# Patient Record
Sex: Male | Born: 1955 | Race: Black or African American | Hispanic: No | Marital: Married | Smoking: Former smoker
Health system: Southern US, Community
[De-identification: ages and names within clinical notes are randomized; demographics above are authoritative.]

## PROBLEM LIST (undated history)

## (undated) DIAGNOSIS — J309 Allergic rhinitis, unspecified: Secondary | ICD-10-CM

## (undated) DIAGNOSIS — B1920 Unspecified viral hepatitis C without hepatic coma: Secondary | ICD-10-CM

## (undated) DIAGNOSIS — E785 Hyperlipidemia, unspecified: Secondary | ICD-10-CM

## (undated) DIAGNOSIS — I10 Essential (primary) hypertension: Secondary | ICD-10-CM

## (undated) DIAGNOSIS — E119 Type 2 diabetes mellitus without complications: Secondary | ICD-10-CM

## (undated) DIAGNOSIS — H5210 Myopia, unspecified eye: Secondary | ICD-10-CM

## (undated) DIAGNOSIS — M130 Polyarthritis, unspecified: Secondary | ICD-10-CM

## (undated) HISTORY — DX: Allergic rhinitis, unspecified: J30.9

## (undated) HISTORY — DX: Myopia, unspecified eye: H52.10

## (undated) HISTORY — DX: Polyarthritis, unspecified: M13.0

## (undated) HISTORY — DX: Hyperlipidemia, unspecified: E78.5

## (undated) HISTORY — PX: TUMOR REMOVAL: SHX12

---

## 2013-09-02 ENCOUNTER — Emergency Department (HOSPITAL_COMMUNITY)
Admission: EM | Admit: 2013-09-02 | Discharge: 2013-09-02 | Discharge status: Against Medical Advice | Payer: Medicaid Other | Attending: Emergency Medicine | Admitting: Emergency Medicine

## 2013-09-02 ENCOUNTER — Encounter (HOSPITAL_COMMUNITY): Payer: Self-pay | Admitting: Emergency Medicine

## 2013-09-02 ENCOUNTER — Emergency Department (HOSPITAL_COMMUNITY): Payer: Medicaid Other

## 2013-09-02 DIAGNOSIS — Z87891 Personal history of nicotine dependence: Secondary | ICD-10-CM | POA: Diagnosis not present

## 2013-09-02 DIAGNOSIS — I10 Essential (primary) hypertension: Secondary | ICD-10-CM | POA: Diagnosis present

## 2013-09-02 DIAGNOSIS — R079 Chest pain, unspecified: Secondary | ICD-10-CM

## 2013-09-02 DIAGNOSIS — R0602 Shortness of breath: Secondary | ICD-10-CM | POA: Insufficient documentation

## 2013-09-02 DIAGNOSIS — G8929 Other chronic pain: Secondary | ICD-10-CM

## 2013-09-02 DIAGNOSIS — H409 Unspecified glaucoma: Secondary | ICD-10-CM | POA: Diagnosis present

## 2013-09-02 DIAGNOSIS — Z8619 Personal history of other infectious and parasitic diseases: Secondary | ICD-10-CM | POA: Diagnosis not present

## 2013-09-02 DIAGNOSIS — E119 Type 2 diabetes mellitus without complications: Secondary | ICD-10-CM | POA: Diagnosis present

## 2013-09-02 HISTORY — DX: Essential (primary) hypertension: I10

## 2013-09-02 HISTORY — DX: Type 2 diabetes mellitus without complications: E11.9

## 2013-09-02 HISTORY — DX: Other chronic pain: G89.29

## 2013-09-02 HISTORY — DX: Chest pain, unspecified: R07.9

## 2013-09-02 HISTORY — DX: Unspecified viral hepatitis C without hepatic coma: B19.20

## 2013-09-02 HISTORY — DX: Unspecified glaucoma: H40.9

## 2013-09-02 LAB — CBC
HCT: 45 % (ref 39.0–52.0)
MCHC: 34 g/dL (ref 30.0–36.0)
Platelets: 180 10*3/uL (ref 150–400)
RDW: 13.7 % (ref 11.5–15.5)
WBC: 8.1 10*3/uL (ref 4.0–10.5)

## 2013-09-02 LAB — POCT I-STAT TROPONIN I: Troponin i, poc: 0 ng/mL (ref 0.00–0.08)

## 2013-09-02 LAB — BASIC METABOLIC PANEL
BUN: 12 mg/dL (ref 6–23)
Chloride: 100 mEq/L (ref 96–112)
GFR calc Af Amer: >90 mL/min (ref >90)
GFR calc non Af Amer: 88 mL/min — ABNORMAL LOW (ref >90)
Potassium: 4.3 mEq/L (ref 3.5–5.1)
Sodium: 137 mEq/L (ref 135–145)

## 2013-09-02 MED ORDER — NITROGLYCERIN 0.4 MG SL SUBL: 0.4000 mg | SUBLINGUAL_TABLET | SUBLINGUAL | Status: DC | PRN

## 2013-09-02 MED ORDER — ASPIRIN 325 MG PO TABS
325.0000 mg | ORAL_TABLET | ORAL | Status: AC
  Administered 2013-09-02: 325 mg via ORAL
  Filled 2013-09-02: qty 1

## 2013-09-02 NOTE — ED Provider Notes (Signed)
Medical screening examination/treatment/procedure(s) were performed by non-physician practitioner and as supervising physician I was immediately available for consultation/collaboration.    Prosper Paff D Lakindra Wible, MD 09/02/13 1934 

## 2013-09-02 NOTE — ED Notes (Signed)
Pt brought back from triage; pt undressed, in gown, on monitor, continuous pulse oximetry and blood pressure cuff; family at bedside; vitals and EKG being performed

## 2013-09-02 NOTE — ED Notes (Signed)
PA at bedside.

## 2013-09-02 NOTE — ED Provider Notes (Signed)
CSN: [id number]     Arrival date & time 09/02/13  4098 History   First MD Initiated Contact with Patient 09/02/13 0901     Chief Complaint  Patient presents with  . Chest Pain   (Consider location/radiation/quality/duration/timing/severity/associated sxs/prior Treatment) HPI Comments: Patient presents to the emergency department with chief complaint of chest pain. He states the chest pain began this morning at around 7:30. He endorses associated shortness of breath, but is uncertain whether he had any diaphoresis. He's not doing anything when the pain occurred. He is states that the pain was crushing in nature. It did not radiate. States that on last for a few minutes. States the pain was 10. He has taken aspirin. Currently, he denies any pain. Cardiac risk factors include hypertension, diabetes, and hyperlipidemia. He does not smoke, nor does he have family history of heart disease.  The history is provided by the patient. No language interpreter was used.    Past Medical History  Diagnosis Date  . Hypertension   . Diabetes mellitus without complication   . 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 Tumor removal      Behind left ear   History reviewed. No pertinent family history. History  Substance Use Topics  . Smoking status: Former Smoker    Quit date: 09/02/2001  . Smokeless tobacco: Never Used  . Alcohol Use: Not on file    Review of Systems  All other systems reviewed and are negative.    Allergies  Review of patient's allergies indicates no known allergies.  Home Medications  No current outpatient prescriptions on file. BP 121/78  Pulse 72  Temp(Src) 98 F (36.7 C) (Oral)  Resp 16  Ht 5\' 11"  (1.803 m)  Wt 194 lb (87.998 kg)  BMI 27.07 kg/m2  SpO2 98% Physical Exam  Nursing note and vitals reviewed. Constitutional: He is oriented to person, place, and time. He appears well-developed and  well-nourished.  HENT:  Head: Normocephalic and atraumatic.  Right Ear: External ear normal.  Left Ear: External ear normal.  Nose: Nose normal.  Mouth/Throat: Oropharynx is clear and moist. No oropharyngeal exudate.  Eyes: Conjunctivae and EOM are normal. Pupils are equal, round, and reactive to light. Right eye exhibits no discharge. Left eye exhibits no discharge. No scleral icterus.  Neck: Normal range of motion. Neck supple. No JVD present.  Cardiovascular: Normal rate, regular rhythm, normal heart sounds and intact distal pulses.  Exam reveals no gallop and no friction rub.   No murmur heard. Pulmonary/Chest: Effort normal and breath sounds normal. No respiratory distress. He has no wheezes. He has no rales. He exhibits no tenderness.  Abdominal: Soft. Bowel sounds are normal. He exhibits no distension and no mass. There is no tenderness. There is no rebound and no guarding.  Musculoskeletal: Normal range of motion. He exhibits no edema and no tenderness.  Neurological: He is alert and oriented to person, place, and time. He has normal reflexes.  CN 3-12 intact  Skin: Skin is warm and dry.  Psychiatric: He has a normal mood and affect. His behavior is normal. Judgment and thought content normal.    ED Course  Procedures (including critical care time) Labs Review Labs Reviewed  CBC  BASIC METABOLIC PANEL   ED ECG REPORT  I personally interpreted this EKG   Date: 09/02/2013   Rate: 75  Rhythm: premature atrial contractions (PAC)  QRS Axis: normal  Intervals:  normal  ST/T Wave abnormalities: normal  Conduction Disutrbances:none  Narrative Interpretation:   Old EKG Reviewed: none available   Results for orders placed during the hospital encounter of 09/02/13  CBC      Result Value Range   WBC 8.1  4.0 - 10.5 K/uL   RBC 5.38  4.22 - 5.81 MIL/uL   Hemoglobin 15.3  13.0 - 17.0 g/dL   HCT 40.9  81.1 - 91.4 %   MCV 83.6  78.0 - 100.0 fL   MCH 28.4  26.0 - 34.0 pg   MCHC  34.0  30.0 - 36.0 g/dL   RDW 78.2  95.6 - 21.3 %   Platelets 180  150 - 400 K/uL  BASIC METABOLIC PANEL      Result Value Range   Sodium 137  135 - 145 mEq/L   Potassium 4.3  3.5 - 5.1 mEq/L   Chloride 100  96 - 112 mEq/L   CO2 26  19 - 32 mEq/L   Glucose, Bld 122 (*) 70 - 99 mg/dL   BUN 12  6 - 23 mg/dL   Creatinine, Ser 0.86  0.50 - 1.35 mg/dL   Calcium 9.8  8.4 - 57.8 mg/dL   GFR calc non Af Amer 88 (*) >90 mL/min   GFR calc Af Amer >90  >90 mL/min  POCT I-STAT TROPONIN I      Result Value Range   Troponin i, poc 0.00  0.00 - 0.08 ng/mL   Comment 3            Dg Chest Port 1 View  09/02/2013   CLINICAL DATA:  Hypertension, chest pressure.  EXAM: PORTABLE CHEST - 1 VIEW  COMPARISON:  None.  FINDINGS: Mild hyperinflation. Heart and mediastinal contours are within normal limits. No focal opacities or effusions. No acute bony abnormality.  IMPRESSION: No active disease.   Electronically Signed   By: Charlett Nose M.D.   On: 09/02/2013 09:47      MDM   1. Chest pain     Patient was crushing substernal chest pain this morning. Currently pain free. Cardiac risk factors include hypertension, diabetes, and hyperlipidemia. The pain was associated with shortness of breath, and it radiates to both arms. Patient is new to the area. He does not have a primary care provider. Patient discussed with Dr. Hyacinth Meeker, who recommends admission for ACS rule out. We'll discuss the patient with internal medicine unassigned.  Heart score is 4, indicating moderate risk. Will admit the patient to internal medicine unassigned.   Patients that they would like to leave.  I have discussed my concerns with the patient about them leaving without completing the evaluation.    Patient presents with chest pain  Symptoms include: chest pain and SOB  Concern for: ACS  Study limitations and other tests offered include: timing and observation  Treatment and recommended follow-up include: continue medications,  stop ginseng, follow-up with [facility].  Patient is not altered, has capacity to make his own decisions.  I do not feel that the patient should leave prior to completing their workup. I have discussed the above symptoms, initial findings, study limitations, and treatment plan with the patient. Patient places themselves at risk of acute coronary syndrome, worsening symptoms, disability, and/or death.   Roxy Horseman, PA-C 09/02/13 1031  Roxy Horseman, PA-C 09/02/13 1101

## 2013-09-02 NOTE — ED Notes (Signed)
Pt from home, c/o cp @730  lasting for a few minutes. Denies pain at this time.

## 2013-09-08 ENCOUNTER — Ambulatory Visit: Payer: Self-pay

## 2013-09-17 ENCOUNTER — Ambulatory Visit: Payer: Self-pay | Attending: Internal Medicine

## 2014-02-12 ENCOUNTER — Emergency Department (HOSPITAL_COMMUNITY)
Admission: EM | Admit: 2014-02-12 | Discharge: 2014-02-12 | Disposition: A | Discharge status: Home / Self Care | Payer: Medicaid Other | Source: Home / Self Care | Attending: Family Medicine | Admitting: Family Medicine

## 2014-02-12 ENCOUNTER — Encounter (HOSPITAL_COMMUNITY): Payer: Self-pay | Admitting: Emergency Medicine

## 2014-02-12 DIAGNOSIS — M3390 Dermatopolymyositis, unspecified, organ involvement unspecified: Secondary | ICD-10-CM

## 2014-02-12 DIAGNOSIS — I10 Essential (primary) hypertension: Secondary | ICD-10-CM

## 2014-02-12 DIAGNOSIS — H409 Unspecified glaucoma: Secondary | ICD-10-CM

## 2014-02-12 LAB — POCT I-STAT, CHEM 8
BUN: 14 mg/dL (ref 6–23)
CALCIUM ION: 1.25 mmol/L — AB (ref 1.12–1.23)
CHLORIDE: 101 meq/L (ref 96–112)
Creatinine, Ser: 1 mg/dL (ref 0.50–1.35)
Glucose, Bld: 157 mg/dL — ABNORMAL HIGH (ref 70–99)
HCT: 50 % (ref 39.0–52.0)
Hemoglobin: 17 g/dL (ref 13.0–17.0)
Potassium: 4 mEq/L (ref 3.7–5.3)
Sodium: 142 mEq/L (ref 137–147)
TCO2: 26 mmol/L (ref 0–100)

## 2014-02-12 MED ORDER — TIMOLOL MALEATE 0.5 % OP SOLN: 1.0000 [drp] | Freq: Every day | OPHTHALMIC | Status: AC

## 2014-02-12 MED ORDER — LISINOPRIL 10 MG PO TABS
10.0000 mg | ORAL_TABLET | Freq: Every day | ORAL | Status: DC
Start: 2014-02-12 — End: 2015-06-22

## 2014-02-12 MED ORDER — SIMVASTATIN 40 MG PO TABS: 40.0000 mg | ORAL_TABLET | Freq: Every day | ORAL | Status: DC

## 2014-02-12 MED ORDER — METFORMIN HCL 1000 MG PO TABS: 1000.0000 mg | ORAL_TABLET | Freq: Two times a day (BID) | ORAL | Status: AC

## 2014-02-12 MED ORDER — TRAVOPROST 0.004 % OP SOLN: 1.0000 [drp] | Freq: Every day | OPHTHALMIC | Status: AC

## 2014-02-12 NOTE — ED Notes (Signed)
Pt reports having to cut metformin pills in half to make them last.  Mw,cma

## 2014-02-12 NOTE — ED Provider Notes (Signed)
Andre Wilcox is a 58 y.o. male who presents to Urgent Care today for medication refill. Patient was recently released from prison. He is running out of his chronic medications. He is asymptomatic. He is establishing with a primary care provider soon. No fevers or chills nausea vomiting or diarrhea. Main medical problems are hypertension diabetes and glaucoma.   Past Medical History  Diagnosis Date  . Hypertension   . Diabetes mellitus without complication   . Unspecified viral hepatitis C without hepatic coma    History  Substance Use Topics  . Smoking status: Former Smoker    Quit date: 09/02/2001  . Smokeless tobacco: Never Used  . Alcohol Use: Not on file   ROS as above Medications: No current facility-administered medications for this encounter.   Current Outpatient Prescriptions  Medication Sig Dispense Refill  . TIMOLOL MALEATE PO Place 1 drop into both eyes every morning.      . Travoprost, BAK Free, (TRAVATAN) 0.004 % SOLN ophthalmic solution Place 1 drop into both eyes at bedtime.      Marland Kitchen. aspirin EC 81 MG tablet Take 81 mg by mouth daily.      Marland Kitchen. lisinopril (PRINIVIL,ZESTRIL) 10 MG tablet Take 1 tablet (10 mg total) by mouth daily.  30 tablet  1  . metFORMIN (GLUCOPHAGE) 1000 MG tablet Take 1 tablet (1,000 mg total) by mouth 2 (two) times daily with a meal.  60 tablet  1  . simvastatin (ZOCOR) 40 MG tablet Take 1 tablet (40 mg total) by mouth daily.  30 tablet  1  . timolol (TIMOPTIC) 0.5 % ophthalmic solution Place 1 drop into both eyes daily.  10 mL  1  . travoprost, benzalkonium, (TRAVATAN) 0.004 % ophthalmic solution Place 1 drop into both eyes at bedtime.  2.5 mL  1    Exam:  BP 154/89  Pulse 75  Temp(Src) 98.1 F (36.7 C) (Oral)  Resp 20  SpO2 98% Gen: Well NAD HEENT: EOMI,  MMM Lungs: Normal work of breathing. CTABL Heart: RRR no MRG Abd: NABS, Soft. NT, ND Exts: Brisk capillary refill, warm and well perfused.   Results for orders placed during the  hospital encounter of 02/12/14 (from the past 24 hour(s))  POCT I-STAT, CHEM 8     Status: Abnormal   Collection Time    02/12/14 11:55 AM      Result Value Ref Range   Sodium 142  137 - 147 mEq/L   Potassium 4.0  3.7 - 5.3 mEq/L   Chloride 101  96 - 112 mEq/L   BUN 14  6 - 23 mg/dL   Creatinine, Ser 1.611.00  0.50 - 1.35 mg/dL   Glucose, Bld 096157 (*) 70 - 99 mg/dL   Calcium, Ion 0.451.25 (*) 1.12 - 1.23 mmol/L   TCO2 26  0 - 100 mmol/L   Hemoglobin 17.0  13.0 - 17.0 g/dL   HCT 40.950.0  81.139.0 - 91.452.0 %   No results found.  Assessment and Plan: 58 y.o. male with hypertension diabetes and glaucoma. Plan to refill Travatan, timolol eyedrops, lisinopril, metformin and simvastatin. 2 month supply.  Followup with primary care provider as soon as possible.  Discussed warning signs or symptoms. Please see discharge instructions. Patient expresses understanding.    Rodolph BongEvan S Rene Gonsoulin, MD 02/12/14 [phone]

## 2014-02-12 NOTE — Discharge Instructions (Signed)
Thank you for coming in today. Establish with a primary doctor and an eye doctor in the next 2 months.  Ask DSS for a list of doctors to get Medicaid in your area. Call or go to the emergency room if you get worse, have trouble breathing, have chest pains, or palpitations.   Arterial Hypertension Arterial hypertension (high blood pressure) is a condition of elevated pressure in your blood vessels. Hypertension over a long period of time is a risk factor for strokes, heart attacks, and heart failure. It is also the leading cause of kidney (renal) failure.  CAUSES   In Adults -- Over 90% of all hypertension has no known cause. This is called essential or primary hypertension. In the other 10% of people with hypertension, the increase in blood pressure is caused by another disorder. This is called secondary hypertension. Important causes of secondary hypertension are:  Heavy alcohol use.  Obstructive sleep apnea.  Hyperaldosterosim (Conn's syndrome).  Steroid use.  Chronic kidney failure.  Hyperparathyroidism.  Medications.  Renal artery stenosis.  Pheochromocytoma.  Cushing's disease.  Coarctation of the aorta.  Scleroderma renal crisis.  Licorice (in excessive amounts).  Drugs (cocaine, methamphetamine). Your caregiver can explain any items above that apply to you.  In Children -- Secondary hypertension is more common and should always be considered.  Pregnancy -- Few women of childbearing age have high blood pressure. However, up to 10% of them develop hypertension of pregnancy. Generally, this will not harm the woman. It may be a sign of 3 complications of pregnancy: preeclampsia, HELLP syndrome, and eclampsia. Follow up and control with medication is necessary. SYMPTOMS   This condition normally does not produce any noticeable symptoms. It is usually found during a routine exam.  Malignant hypertension is a late problem of high blood pressure. It may have the  following symptoms:  Headaches.  Blurred vision.  End-organ damage (this means your kidneys, heart, lungs, and other organs are being damaged).  Stressful situations can increase the blood pressure. If a person with normal blood pressure has their blood pressure go up while being seen by their caregiver, this is often termed "white coat hypertension." Its importance is not known. It may be related with eventually developing hypertension or complications of hypertension.  Hypertension is often confused with mental tension, stress, and anxiety. DIAGNOSIS  The diagnosis is made by 3 separate blood pressure measurements. They are taken at least 1 week apart from each other. If there is organ damage from hypertension, the diagnosis may be made without repeat measurements. Hypertension is usually identified by having blood pressure readings:  Above 140/90 mmHg measured in both arms, at 3 separate times, over a couple weeks.  Over 130/80 mmHg should be considered a risk factor and may require treatment in patients with diabetes. Blood pressure readings over 120/80 mmHg are called "pre-hypertension" even in non-diabetic patients. To get a true blood pressure measurement, use the following guidelines. Be aware of the factors that can alter blood pressure readings.  Take measurements at least 1 hour after caffeine.  Take measurements 30 minutes after smoking and without any stress. This is another reason to quit smoking  it raises your blood pressure.  Use a proper cuff size. Ask your caregiver if you are not sure about your cuff size.  Most home blood pressure cuffs are automatic. They will measure systolic and diastolic pressures. The systolic pressure is the pressure reading at the start of sounds. Diastolic pressure is the pressure at which  the sounds disappear. If you are elderly, measure pressures in multiple postures. Try sitting, lying or standing.  Sit at rest for a minimum of 5 minutes  before taking measurements.  You should not be on any medications like decongestants. These are found in many cold medications.  Record your blood pressure readings and review them with your caregiver. If you have hypertension:  Your caregiver may do tests to be sure you do not have secondary hypertension (see "causes" above).  Your caregiver may also look for signs of metabolic syndrome. This is also called Syndrome X or Insulin Resistance Syndrome. You may have this syndrome if you have type 2 diabetes, abdominal obesity, and abnormal blood lipids in addition to hypertension.  Your caregiver will take your medical and family history and perform a physical exam.  Diagnostic tests may include blood tests (for glucose, cholesterol, potassium, and kidney function), a urinalysis, or an EKG. Other tests may also be necessary depending on your condition. PREVENTION  There are important lifestyle issues that you can adopt to reduce your chance of developing hypertension:  Maintain a normal weight.  Limit the amount of salt (sodium) in your diet.  Exercise often.  Limit alcohol intake.  Get enough potassium in your diet. Discuss specific advice with your caregiver.  Follow a DASH diet (dietary approaches to stop hypertension). This diet is rich in fruits, vegetables, and low-fat dairy products, and avoids certain fats. PROGNOSIS  Essential hypertension cannot be cured. Lifestyle changes and medical treatment can lower blood pressure and reduce complications. The prognosis of secondary hypertension depends on the underlying cause. Many people whose hypertension is controlled with medicine or lifestyle changes can live a normal, healthy life.  RISKS AND COMPLICATIONS  While high blood pressure alone is not an illness, it often requires treatment due to its short- and long-term effects on many organs. Hypertension increases your risk for:  CVAs or strokes (cerebrovascular accident).  Heart  failure due to chronically high blood pressure (hypertensive cardiomyopathy).  Heart attack (myocardial infarction).  Damage to the retina (hypertensive retinopathy).  Kidney failure (hypertensive nephropathy). Your caregiver can explain list items above that apply to you. Treatment of hypertension can significantly reduce the risk of complications. TREATMENT   For overweight patients, weight loss and regular exercise are recommended. Physical fitness lowers blood pressure.  Mild hypertension is usually treated with diet and exercise. A diet rich in fruits and vegetables, fat-free dairy products, and foods low in fat and salt (sodium) can help lower blood pressure. Decreasing salt intake decreases blood pressure in a 1/3 of people.  Stop smoking if you are a smoker. The steps above are highly effective in reducing blood pressure. While these actions are easy to suggest, they are difficult to achieve. Most patients with moderate or severe hypertension end up requiring medications to bring their blood pressure down to a normal level. There are several classes of medications for treatment. Blood pressure pills (antihypertensives) will lower blood pressure by their different actions. Lowering the blood pressure by 10 mmHg may decrease the risk of complications by as much as 25%. The goal of treatment is effective blood pressure control. This will reduce your risk for complications. Your caregiver will help you determine the best treatment for you according to your lifestyle. What is excellent treatment for one person, may not be for you. HOME CARE INSTRUCTIONS   Do not smoke.  Follow the lifestyle changes outlined in the "Prevention" section.  If you are on medications, follow  the directions carefully. Blood pressure medications must be taken as prescribed. Skipping doses reduces their benefit. It also puts you at risk for problems.  Follow up with your caregiver, as directed.  If you are  asked to monitor your blood pressure at home, follow the guidelines in the "Diagnosis" section above. SEEK MEDICAL CARE IF:   You think you are having medication side effects.  You have recurrent headaches or lightheadedness.  You have swelling in your ankles.  You have trouble with your vision. SEEK IMMEDIATE MEDICAL CARE IF:   You have sudden onset of chest pain or pressure, difficulty breathing, or other symptoms of a heart attack.  You have a severe headache.  You have symptoms of a stroke (such as sudden weakness, difficulty speaking, difficulty walking). MAKE SURE YOU:   Understand these instructions.  Will watch your condition.  Will get help right away if you are not doing well or get worse. Document Released: 12/03/2005 Document Revised: 02/25/2012 Document Reviewed: 07/03/2007 [facility] Patient Information 2014 [place], [place].

## 2014-02-12 NOTE — ED Notes (Signed)
Needs refill on medication.  No pcp.  Recently released from prison.

## 2015-01-18 ENCOUNTER — Other Ambulatory Visit (HOSPITAL_COMMUNITY): Payer: Self-pay | Admitting: Nurse Practitioner

## 2015-01-18 DIAGNOSIS — B182 Chronic viral hepatitis C: Secondary | ICD-10-CM

## 2015-01-18 DIAGNOSIS — R768 Other specified abnormal immunological findings in serum: Secondary | ICD-10-CM

## 2015-01-18 DIAGNOSIS — R748 Abnormal levels of other serum enzymes: Secondary | ICD-10-CM

## 2015-02-04 ENCOUNTER — Other Ambulatory Visit: Payer: Self-pay | Admitting: Radiology

## 2015-02-07 ENCOUNTER — Ambulatory Visit (HOSPITAL_COMMUNITY)
Admission: RE | Admit: 2015-02-07 | Discharge: 2015-02-07 | Disposition: A | Discharge status: Home / Self Care | Payer: Medicaid Other | Source: Ambulatory Visit | Attending: Nurse Practitioner | Admitting: Nurse Practitioner

## 2015-02-07 DIAGNOSIS — R768 Other specified abnormal immunological findings in serum: Secondary | ICD-10-CM

## 2015-02-07 DIAGNOSIS — R748 Abnormal levels of other serum enzymes: Secondary | ICD-10-CM

## 2015-02-07 DIAGNOSIS — B182 Chronic viral hepatitis C: Secondary | ICD-10-CM

## 2015-02-17 ENCOUNTER — Other Ambulatory Visit: Payer: Self-pay | Admitting: Radiology

## 2015-02-21 ENCOUNTER — Ambulatory Visit (HOSPITAL_COMMUNITY)
Admission: RE | Admit: 2015-02-21 | Discharge: 2015-02-21 | Disposition: A | Discharge status: Home / Self Care | Payer: Medicaid Other | Source: Ambulatory Visit | Attending: Nurse Practitioner | Admitting: Nurse Practitioner

## 2015-02-21 ENCOUNTER — Encounter (HOSPITAL_COMMUNITY): Payer: Self-pay

## 2015-02-21 VITALS — BP 126/80 | HR 72 | Temp 97.1°F | Resp 20 | Ht 71.0 in | Wt 200.0 lb

## 2015-02-21 DIAGNOSIS — Z87891 Personal history of nicotine dependence: Secondary | ICD-10-CM | POA: Diagnosis not present

## 2015-02-21 DIAGNOSIS — B182 Chronic viral hepatitis C: Secondary | ICD-10-CM

## 2015-02-21 DIAGNOSIS — Z79899 Other long term (current) drug therapy: Secondary | ICD-10-CM | POA: Diagnosis not present

## 2015-02-21 DIAGNOSIS — R7989 Other specified abnormal findings of blood chemistry: Secondary | ICD-10-CM | POA: Diagnosis present

## 2015-02-21 DIAGNOSIS — K739 Chronic hepatitis, unspecified: Secondary | ICD-10-CM | POA: Insufficient documentation

## 2015-02-21 DIAGNOSIS — Z7982 Long term (current) use of aspirin: Secondary | ICD-10-CM | POA: Diagnosis not present

## 2015-02-21 DIAGNOSIS — R768 Other specified abnormal immunological findings in serum: Secondary | ICD-10-CM

## 2015-02-21 DIAGNOSIS — Z8619 Personal history of other infectious and parasitic diseases: Secondary | ICD-10-CM | POA: Insufficient documentation

## 2015-02-21 DIAGNOSIS — K7581 Nonalcoholic steatohepatitis (NASH): Secondary | ICD-10-CM | POA: Diagnosis not present

## 2015-02-21 DIAGNOSIS — E119 Type 2 diabetes mellitus without complications: Secondary | ICD-10-CM | POA: Diagnosis not present

## 2015-02-21 DIAGNOSIS — R748 Abnormal levels of other serum enzymes: Secondary | ICD-10-CM

## 2015-02-21 DIAGNOSIS — I10 Essential (primary) hypertension: Secondary | ICD-10-CM | POA: Insufficient documentation

## 2015-02-21 LAB — CBC
HEMATOCRIT: 43.2 % (ref 39.0–52.0)
HEMOGLOBIN: 14.8 g/dL (ref 13.0–17.0)
MCH: 30.5 pg (ref 26.0–34.0)
MCHC: 34.3 g/dL (ref 30.0–36.0)
MCV: 89.1 fL (ref 78.0–100.0)
Platelets: 145 10*3/uL — ABNORMAL LOW (ref 150–400)
RBC: 4.85 MIL/uL (ref 4.22–5.81)
RDW: 12.9 % (ref 11.5–15.5)
WBC: 9.7 10*3/uL (ref 4.0–10.5)

## 2015-02-21 LAB — PROTIME-INR
INR: 1.03 (ref 0.00–1.49)
PROTHROMBIN TIME: 13.6 s (ref 11.6–15.2)

## 2015-02-21 LAB — APTT: APTT: 27 s (ref 24–37)

## 2015-02-21 LAB — GLUCOSE, CAPILLARY
Glucose-Capillary: 193 mg/dL — ABNORMAL HIGH (ref 70–99)
Glucose-Capillary: 190 mg/dL — ABNORMAL HIGH (ref 70–99)

## 2015-02-21 MED ORDER — MIDAZOLAM HCL 2 MG/2ML IJ SOLN
INTRAMUSCULAR | Status: AC
  Filled 2015-02-21: qty 2

## 2015-02-21 MED ORDER — MIDAZOLAM HCL 2 MG/2ML IJ SOLN
INTRAMUSCULAR | Status: AC | PRN
  Administered 2015-02-21 (×2): 1 mg via INTRAVENOUS

## 2015-02-21 MED ORDER — HYDROCODONE-ACETAMINOPHEN 5-325 MG PO TABS
1.0000 | ORAL_TABLET | ORAL | Status: DC | PRN
  Filled 2015-02-21: qty 2

## 2015-02-21 MED ORDER — LIDOCAINE HCL (PF) 1 % IJ SOLN
INTRAMUSCULAR | Status: AC
  Filled 2015-02-21: qty 10

## 2015-02-21 MED ORDER — SODIUM CHLORIDE 0.9 % IV SOLN
INTRAVENOUS | Status: DC
  Administered 2015-02-21: 09:00:00 via INTRAVENOUS

## 2015-02-21 MED ORDER — FENTANYL CITRATE 0.05 MG/ML IJ SOLN
INTRAMUSCULAR | Status: AC | PRN
  Administered 2015-02-21: 50 ug via INTRAVENOUS

## 2015-02-21 MED ORDER — FENTANYL CITRATE 0.05 MG/ML IJ SOLN
INTRAMUSCULAR | Status: AC
  Filled 2015-02-21: qty 2

## 2015-02-21 MED ORDER — GELATIN ABSORBABLE 12-7 MM EX MISC
CUTANEOUS | Status: AC
  Filled 2015-02-21: qty 1

## 2015-02-21 NOTE — Sedation Documentation (Signed)
Patient denies pain and is resting comfortably.  

## 2015-02-21 NOTE — Discharge Instructions (Signed)
Liver Biopsy, Care After °These instructions give you information on caring for yourself after your procedure. Your doctor may also give you more specific instructions. Call your doctor if you have any problems or questions after your procedure. °HOME CARE °· Rest at home for 1-2 days or as told by your doctor. °· Have someone stay with you for at least 24 hours. °· Do not do these things in the first 24 hours: °¨ Drive. °¨ Use machinery. °¨ Take care of other people. °¨ Sign legal documents. °¨ Take a bath or shower. °· There are many different ways to close and cover a cut (incision). For example, a cut can be closed with stitches, skin glue, or adhesive strips. Follow your doctor's instructions on: °¨ Taking care of your cut. °¨ Changing and removing your bandage (dressing). °¨ Removing whatever was used to close your cut. °· Do not drink alcohol in the first week. °· Do not lift more than 5 pounds or play contact sports for the first 2 weeks. °· Take medicines only as told by your doctor. For 1 week, do not take medicine that has aspirin in it or medicines like ibuprofen. °· Get your test results. °GET HELP IF: °· A cut bleeds and leaves more than just a small spot of blood. °· A cut is red, puffs up (swells), or hurts more than before. °· Fluid or something else comes from a cut. °· A cut smells bad. °· You have a fever or chills. °GET HELP RIGHT AWAY IF: °· You have swelling, bloating, or pain in your belly (abdomen). °· You get dizzy or faint. °· You have a rash. °· You feel sick to your stomach (nauseous) or throw up (vomit). °· You have trouble breathing, feel short of breath, or feel faint. °· Your chest hurts. °· You have problems talking or seeing. °· You have trouble balancing or moving your arms or legs. °Document Released: 09/11/2008 Document Revised: 04/19/2014 Document Reviewed: 01/29/2014 °[employer]® Patient Information ©2015 [employer]. This information is not intended to replace advice given to  you by your health care provider. Make sure you discuss any questions you have with your health care provider. ° °

## 2015-02-21 NOTE — H&P (Signed)
Chief Complaint: "I'm here for a liver biopsy" Referring Physician:Dawn Drazek, NP HPI: Andre Wilcox is an 59 y.o. male with elevated liver enzymes. He has remote hx of hep C. He has been seen at the [facility] and is referred for random liver biopsy. PMHx, meds, labs, imaging reviewed. Has been NPO today  Past Medical History:  Past Medical History  Diagnosis Date  . Hypertension   . Diabetes mellitus without complication   . Unspecified viral hepatitis C without hepatic coma     Past Surgical History:  Past Surgical History  Procedure Laterality Date  . Tumor removal      Behind left ear    Family History: History reviewed. No pertinent family history.  Social History:  reports that he quit smoking about 13 years ago. He has never used smokeless tobacco. He reports that he does not use illicit drugs. His alcohol history is not on file.  Allergies: No Known Allergies  Medications:   Medication List    ASK your doctor about these medications        aspirin EC 81 MG tablet  Take 81 mg by mouth daily.     lisinopril 10 MG tablet  Commonly known as:  PRINIVIL,ZESTRIL  Take 1 tablet (10 mg total) by mouth daily.     metFORMIN 1000 MG tablet  Commonly known as:  GLUCOPHAGE  Take 1 tablet (1,000 mg total) by mouth 2 (two) times daily with a meal.     simvastatin 40 MG tablet  Commonly known as:  ZOCOR  Take 1 tablet (40 mg total) by mouth daily.     sitaGLIPtin 100 MG tablet  Commonly known as:  JANUVIA  Take 100 mg by mouth daily.     timolol 0.5 % ophthalmic solution  Commonly known as:  TIMOPTIC  Place 1 drop into both eyes daily.     travoprost (benzalkonium) 0.004 % ophthalmic solution  Commonly known as:  TRAVATAN  Place 1 drop into both eyes at bedtime.        Please HPI for pertinent positives, otherwise complete 10 system ROS negative.  Physical Exam: BP 122/86 mmHg  Pulse 84  Temp(Src) 98.3 F (36.8 C) (Oral)  Resp 18  Ht 5'  11" (1.803 m)  Wt 200 lb (90.719 kg)  BMI 27.91 kg/m2  SpO2 99% Body mass index is 27.91 kg/(m^2).   General Appearance:  Alert, cooperative, no distress, appears stated age  Head:  Normocephalic, without obvious abnormality, atraumatic  ENT: Unremarkable  Neck: Supple, symmetrical, trachea midline  Lungs:   Clear to auscultation bilaterally, no w/r/r, respirations unlabored without use of accessory muscles.  Chest Wall:  No tenderness or deformity  Heart:  Regular rate and rhythm, S1, S2 normal, no murmur, rub or gallop.  Abdomen:   Soft, non-tender, non distended.  Neurologic: Normal affect, no gross deficits.  Labs: Results for orders placed or performed during the hospital encounter of 02/21/15 (from the past 48 hour(s))  Glucose, capillary     Status: Abnormal   Collection Time: 02/21/15  8:29 AM  Result Value Ref Range   Glucose-Capillary 193 (H) 70 - 99 mg/dL    Imaging: No results found.  Assessment/Plan Elevated LFTs For US guided random liver biopsy Risks and Benefits discussed with the patient including, but not limited to bleeding, infection, damage to adjacent structures or low yield requiring additional tests. All of the patient's questions were answered, patient is agreeable to proceed. Consent signed and in  chartBrayton El.   Carynn Felling PA-C 02/21/2015, 9:27 AM

## 2015-02-21 NOTE — Sedation Documentation (Signed)
Patient is resting comfortably. 

## 2015-02-21 NOTE — Procedures (Signed)
Random liver Bx No comp 

## 2015-06-21 ENCOUNTER — Encounter (HOSPITAL_COMMUNITY): Payer: Self-pay | Admitting: Emergency Medicine

## 2015-06-21 DIAGNOSIS — Z87891 Personal history of nicotine dependence: Secondary | ICD-10-CM | POA: Insufficient documentation

## 2015-06-21 DIAGNOSIS — E119 Type 2 diabetes mellitus without complications: Secondary | ICD-10-CM | POA: Insufficient documentation

## 2015-06-21 DIAGNOSIS — X58XXXA Exposure to other specified factors, initial encounter: Secondary | ICD-10-CM | POA: Insufficient documentation

## 2015-06-21 DIAGNOSIS — Z8619 Personal history of other infectious and parasitic diseases: Secondary | ICD-10-CM | POA: Diagnosis not present

## 2015-06-21 DIAGNOSIS — R220 Localized swelling, mass and lump, head: Secondary | ICD-10-CM | POA: Diagnosis present

## 2015-06-21 DIAGNOSIS — Z7982 Long term (current) use of aspirin: Secondary | ICD-10-CM | POA: Insufficient documentation

## 2015-06-21 DIAGNOSIS — Z79899 Other long term (current) drug therapy: Secondary | ICD-10-CM | POA: Insufficient documentation

## 2015-06-21 DIAGNOSIS — Y998 Other external cause status: Secondary | ICD-10-CM | POA: Diagnosis not present

## 2015-06-21 DIAGNOSIS — T783XXA Angioneurotic edema, initial encounter: Secondary | ICD-10-CM | POA: Diagnosis not present

## 2015-06-21 DIAGNOSIS — Y9289 Other specified places as the place of occurrence of the external cause: Secondary | ICD-10-CM | POA: Diagnosis not present

## 2015-06-21 DIAGNOSIS — Y9389 Activity, other specified: Secondary | ICD-10-CM | POA: Diagnosis not present

## 2015-06-21 DIAGNOSIS — I10 Essential (primary) hypertension: Secondary | ICD-10-CM | POA: Diagnosis not present

## 2015-06-21 DIAGNOSIS — T464X5A Adverse effect of angiotensin-converting-enzyme inhibitors, initial encounter: Secondary | ICD-10-CM | POA: Insufficient documentation

## 2015-06-21 NOTE — ED Notes (Signed)
Pt. reports worsening swelling at lower and upper lips this evening , denies SOB , respirations unlabored , airway intact .

## 2015-06-22 ENCOUNTER — Emergency Department (HOSPITAL_COMMUNITY)
Admission: EM | Admit: 2015-06-22 | Discharge: 2015-06-22 | Disposition: A | Discharge status: Home / Self Care | Payer: Medicaid Other | Attending: Emergency Medicine | Admitting: Emergency Medicine

## 2015-06-22 DIAGNOSIS — T464X5A Adverse effect of angiotensin-converting-enzyme inhibitors, initial encounter: Secondary | ICD-10-CM

## 2015-06-22 DIAGNOSIS — T783XXA Angioneurotic edema, initial encounter: Secondary | ICD-10-CM

## 2015-06-22 LAB — CBC WITH DIFFERENTIAL/PLATELET
BASOS ABS: 0.1 10*3/uL (ref 0.0–0.1)
Basophils Relative: 1 % (ref 0–1)
EOS PCT: 4 % (ref 0–5)
Eosinophils Absolute: 0.4 10*3/uL (ref 0.0–0.7)
HCT: 44.9 % (ref 39.0–52.0)
HEMOGLOBIN: 15.6 g/dL (ref 13.0–17.0)
LYMPHS ABS: 3.3 10*3/uL (ref 0.7–4.0)
Lymphocytes Relative: 31 % (ref 12–46)
MCH: 31.1 pg (ref 26.0–34.0)
MCHC: 34.7 g/dL (ref 30.0–36.0)
MCV: 89.4 fL (ref 78.0–100.0)
MONOS PCT: 10 % (ref 3–12)
Monocytes Absolute: 1.1 10*3/uL — ABNORMAL HIGH (ref 0.1–1.0)
NEUTROS ABS: 5.7 10*3/uL (ref 1.7–7.7)
Neutrophils Relative %: 54 % (ref 43–77)
Platelets: 171 10*3/uL (ref 150–400)
RBC: 5.02 MIL/uL (ref 4.22–5.81)
RDW: 12.8 % (ref 11.5–15.5)
WBC: 10.5 10*3/uL (ref 4.0–10.5)

## 2015-06-22 LAB — COMPREHENSIVE METABOLIC PANEL
ALBUMIN: 3.7 g/dL (ref 3.5–5.0)
ALK PHOS: 52 U/L (ref 38–126)
ALT: 57 U/L (ref 17–63)
AST: 42 U/L — ABNORMAL HIGH (ref 15–41)
Anion gap: 11 (ref 5–15)
BILIRUBIN TOTAL: 0.7 mg/dL (ref 0.3–1.2)
BUN: 23 mg/dL — ABNORMAL HIGH (ref 6–20)
CHLORIDE: 102 mmol/L (ref 101–111)
CO2: 24 mmol/L (ref 22–32)
CREATININE: 1.31 mg/dL — AB (ref 0.61–1.24)
Calcium: 10.1 mg/dL (ref 8.9–10.3)
GFR calc Af Amer: >60 mL/min (ref >60)
GFR, EST NON AFRICAN AMERICAN: 58 mL/min — AB (ref >60)
Glucose, Bld: 215 mg/dL — ABNORMAL HIGH (ref 65–99)
POTASSIUM: 4.2 mmol/L (ref 3.5–5.1)
SODIUM: 137 mmol/L (ref 135–145)
Total Protein: 7.9 g/dL (ref 6.5–8.1)

## 2015-06-22 MED ORDER — FAMOTIDINE IN NACL 20-0.9 MG/50ML-% IV SOLN
20.0000 mg | Freq: Once | INTRAVENOUS | Status: AC
  Administered 2015-06-22: 20 mg via INTRAVENOUS
  Filled 2015-06-22: qty 50

## 2015-06-22 MED ORDER — METHYLPREDNISOLONE SODIUM SUCC 125 MG IJ SOLR
125.0000 mg | Freq: Once | INTRAMUSCULAR | Status: AC
  Administered 2015-06-22: 125 mg via INTRAVENOUS
  Filled 2015-06-22: qty 2

## 2015-06-22 MED ORDER — FAMOTIDINE 20 MG PO TABS: 20.0000 mg | ORAL_TABLET | Freq: Two times a day (BID) | ORAL | Status: DC

## 2015-06-22 MED ORDER — PREDNISONE 20 MG PO TABS: 60.0000 mg | ORAL_TABLET | Freq: Every day | ORAL | Status: DC

## 2015-06-22 MED ORDER — DIPHENHYDRAMINE HCL 25 MG PO TABS: 25.0000 mg | ORAL_TABLET | Freq: Four times a day (QID) | ORAL | Status: DC

## 2015-06-22 NOTE — ED Provider Notes (Signed)
CSN: [id number]     Arrival date & time 06/21/15  2334 History  This chart was scribed for  Marisa Severinlga Sylvain Hasten, MD by Bethel BornBritney McCollum, ED Scribe. This patient was seen in room B18C/B18C and the patient's care was started at 12:16 AM.   Chief Complaint  Patient presents with  . Angioedema    The history is provided by the patient and the spouse. No language interpreter was used.   Hinda Lenisugustine Carlynn Purlerez is a 59 y.o. male who presents to the Emergency Department complaining of increasing angioedema with gradual onset around 4 PM. His last dose was of Benadryl was around 9 PM, he has had 2 doses total with no improvement. He is on lisinopril but has not had this reaction in the past. Pt denies difficulty breathing or swallowing.    Past Medical History  Diagnosis Date  . Hypertension   . Diabetes mellitus without complication   . 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 Tumor removal      Behind left ear   No family history on file. History  Substance Use Topics  . Smoking status: Former Smoker    Quit date: 09/02/2001  . Smokeless tobacco: Never Used  . Alcohol Use: Not on file    Review of Systems  Constitutional: Negative for fever, activity change, appetite change and fatigue.  HENT: Negative for congestion, facial swelling, rhinorrhea and trouble swallowing.        Angioedema  Eyes: Negative for photophobia and pain.  Respiratory: Negative for cough, chest tightness and shortness of breath.   Cardiovascular: Negative for chest pain and leg swelling.  Gastrointestinal: Negative for nausea, vomiting, abdominal pain, diarrhea and constipation.  Endocrine: Negative for polydipsia and polyuria.  Genitourinary: Negative for dysuria, urgency, decreased urine volume and difficulty urinating.  Musculoskeletal: Negative for back pain and gait problem.  Skin: Negative for color change, rash and wound.  Allergic/Immunologic: Negative  for immunocompromised state.  Neurological: Negative for dizziness, facial asymmetry, speech difficulty, weakness, numbness and headaches.  Psychiatric/Behavioral: Negative for confusion, decreased concentration and agitation.      Allergies  Review of patient's allergies indicates no known allergies.  Home Medications   Prior to Admission medications   Medication Sig Start Date End Date Taking? Authorizing Provider  aspirin EC 81 MG tablet Take 81 mg by mouth daily.    Historical Provider, MD  lisinopril (PRINIVIL,ZESTRIL) 10 MG tablet Take 1 tablet (10 mg total) by mouth daily. 02/12/14   Rodolph BongEvan S Corey, MD  metFORMIN (GLUCOPHAGE) 1000 MG tablet Take 1 tablet (1,000 mg total) by mouth 2 (two) times daily with a meal. 02/12/14   Rodolph BongEvan S Corey, MD  simvastatin (ZOCOR) 40 MG tablet Take 1 tablet (40 mg total) by mouth daily. 02/12/14   Rodolph BongEvan S Corey, MD  sitaGLIPtin (JANUVIA) 100 MG tablet Take 100 mg by mouth daily.    Historical Provider, MD  timolol (TIMOPTIC) 0.5 % ophthalmic solution Place 1 drop into both eyes daily. 02/12/14   Rodolph BongEvan S Corey, MD  travoprost, benzalkonium, (TRAVATAN) 0.004 % ophthalmic solution Place 1 drop into both eyes at bedtime. 02/12/14   Rodolph BongEvan S Corey, MD   Triage Vitals: BP 147/84 mmHg  Pulse 75  Temp(Src) 98.2 F (36.8 C) (Oral)  Resp 12  Ht 5\' 11"  (1.803 m)  Wt 204 lb (92.534 kg)  BMI 28.46 kg/m2  SpO2 100%  Physical Exam  Constitutional: He is oriented  to person, place, and time. He appears well-developed and well-nourished.  HENT:  Head: Normocephalic and atraumatic.  Nose: Nose normal.  Mouth/Throat: Oropharynx is clear and moist.  Patient has significant angioedema involving upper and lower lips.  there is no  Tongue or posterior pharynx involvement  Eyes: Conjunctivae and EOM are normal. Pupils are equal, round, and reactive to light.  Neck: Normal range of motion. Neck supple. No JVD present. No tracheal deviation present. No thyromegaly present.   Cardiovascular: Normal rate, regular rhythm, normal heart sounds and intact distal pulses.  Exam reveals no gallop and no friction rub.   No murmur heard. Pulmonary/Chest: Effort normal and breath sounds normal. No stridor. No respiratory distress. He has no wheezes. He has no rales. He exhibits no tenderness.  Abdominal: Soft. Bowel sounds are normal. He exhibits no distension and no mass. There is no tenderness. There is no rebound and no guarding.  Musculoskeletal: Normal range of motion. He exhibits no edema or tenderness.  Lymphadenopathy:    He has no cervical adenopathy.  Neurological: He is alert and oriented to person, place, and time. He displays normal reflexes. He exhibits normal muscle tone. Coordination normal.  Skin: Skin is warm and dry. No rash noted. No erythema. No pallor.  Psychiatric: He has a normal mood and affect. His behavior is normal. Judgment and thought content normal.  Nursing note and vitals reviewed.   ED Course  Procedures   DIAGNOSTIC STUDIES: Oxygen Saturation is 100% on RA, normal by my interpretation.    COORDINATION OF CARE: 12:18 AM Discussed treatment plan which includes lab work, steroids, and a cold compress with pt at bedside and pt agreed to plan. I informed him that the steroids will increase his blood sugar.   2:02 AM I re-evaluated the patient and provided an update on the treatment plan including discharge to f/u with PCP with prednisone, Benadryl, and Pepcid.  Labs Review Labs Reviewed  CBC WITH DIFFERENTIAL/PLATELET - Abnormal; Notable for the following:    Monocytes Absolute 1.1 (*)    All other components within normal limits  COMPREHENSIVE METABOLIC PANEL    Imaging Review No results found.   EKG Interpretation None      MDM   Final diagnoses:  Angioedema of lips, initial encounter  Adverse reaction to lisinopril, initial encounter    I personally performed the services described in this documentation, which was  scribed in my presence. The recorded information has been reviewed and is accurate.  59 year old male with angioedema.  It appears to only involve his lips.  Patient is on lisinopril.  Patient instructed to stop using lisinopril.  He has tried Benadryl at home without improvement.  Will use cold compresses, Pepcid, Solu-Medrol.  Patient is diabetic and advised that his blood sugars will be elevated while he was on steroids  Patient with some improvement in angioedema.  No airway involvement.  Patient is comfort for discharge home.  He will contact his doctor today for follow-up.     Marisa Severin, MD 06/22/15 [phone]

## 2015-06-22 NOTE — Discharge Instructions (Signed)
Angioedema °Angioedema is a sudden swelling of tissues, often of the skin. It can occur on the face or genitals or in the abdomen or other body parts. The swelling usually develops over a short period and gets better in 24 to 48 hours. It often begins during the night and is found when the person wakes up. The person may also get red, itchy patches of skin (hives). Angioedema can be dangerous if it involves swelling of the air passages.  °Depending on the cause, episodes of angioedema may only happen once, come back in unpredictable patterns, or repeat for several years and then gradually fade away.  °CAUSES  °Angioedema can be caused by an allergic reaction to various triggers. It can also result from nonallergic causes, including reactions to drugs, immune system disorders, viral infections, or an abnormal gene that is passed to you from your parents (hereditary). For some people with angioedema, the cause is unknown.  °Some things that can trigger angioedema include:  °· Foods.   °· Medicines, such as ACE inhibitors, ARBs, nonsteroidal anti-inflammatory agents, or estrogen.   °· Latex.   °· Animal saliva.   °· Insect stings.   °· Dyes used in X-rays.   °· Mild injury.   °· Dental work. °· Surgery. °· Stress.   °· Sudden changes in temperature.   °· Exercise. °SIGNS AND SYMPTOMS  °· Swelling of the skin. °· Hives. If these are present, there is also intense itching. °· Redness in the affected area.   °· Pain in the affected area. °· Swollen lips or tongue. °· Breathing problems. This may happen if the air passages swell. °· Wheezing. °If internal organs are involved, there may be:  °· Nausea.   °· Abdominal pain.   °· Vomiting.   °· Difficulty swallowing.   °· Difficulty passing urine. °DIAGNOSIS  °· Your health care provider will examine the affected area and take a medical and family history. °· Various tests may be done to help determine the cause. Tests may include: °¨ Allergy skin tests to see if the problem  is an allergic reaction.   °¨ Blood tests to check for hereditary angioedema.   °¨ Tests to check for underlying diseases that could cause the condition.   °· A review of your medicines, including over-the-counter medicines, may be done. °TREATMENT  °Treatment will depend on the cause of the angioedema. Possible treatments include:  °· Removal of anything that triggered the condition (such as stopping certain medicines).   °· Medicines to treat symptoms or prevent attacks. Medicines given may include:   °¨ Antihistamines.   °¨ Epinephrine injection.   °¨ Steroids.   °· Hospitalization may be required for severe attacks. If the air passages are affected, it can be an emergency. Tubes may need to be placed to keep the airway open. °HOME CARE INSTRUCTIONS  °· Take all medicines as directed by your health care provider. °· If you were given medicines for emergency allergy treatment, always carry them with you. °· Wear a medical bracelet as directed by your health care provider.   °· Avoid known triggers. °SEEK MEDICAL CARE IF:  °· You have repeat attacks of angioedema.   °· Your attacks are more frequent or more severe despite preventive measures.   °· You have hereditary angioedema and are considering having children. It is important to discuss with your health care provider the risks of passing the condition on to your children. °SEEK IMMEDIATE MEDICAL CARE IF:  °· You have severe swelling of the mouth, tongue, or lips. °· You have difficulty breathing.   °· You have difficulty swallowing.   °· You faint. °MAKE   SURE YOU: °· Understand these instructions. °· Will watch your condition. °· Will get help right away if you are not doing well or get worse. °Document Released: 02/11/2002 Document Revised: 04/19/2014 Document Reviewed: 07/27/2013 °[employer]® Patient Information ©2015 [employer]. This information is not intended to replace advice given to you by your health care provider. Make sure you discuss any questions  you have with your health care provider. ° °

## 2015-06-22 NOTE — ED Notes (Signed)
Pt. Left with all belongings and refused wheelchair 

## 2016-12-24 ENCOUNTER — Emergency Department (HOSPITAL_COMMUNITY): Payer: Medicaid Other

## 2016-12-24 ENCOUNTER — Inpatient Hospital Stay (HOSPITAL_COMMUNITY)
Admission: EM | Admit: 2016-12-24 | Discharge: 2016-12-27 | DRG: 392 | Disposition: A | Discharge status: Home / Self Care | Payer: Medicaid Other | Attending: Surgery | Admitting: Surgery

## 2016-12-24 ENCOUNTER — Encounter (HOSPITAL_COMMUNITY): Payer: Self-pay

## 2016-12-24 DIAGNOSIS — G8929 Other chronic pain: Secondary | ICD-10-CM | POA: Diagnosis present

## 2016-12-24 DIAGNOSIS — Z87891 Personal history of nicotine dependence: Secondary | ICD-10-CM

## 2016-12-24 DIAGNOSIS — Z7984 Long term (current) use of oral hypoglycemic drugs: Secondary | ICD-10-CM

## 2016-12-24 DIAGNOSIS — B1920 Unspecified viral hepatitis C without hepatic coma: Secondary | ICD-10-CM | POA: Diagnosis present

## 2016-12-24 DIAGNOSIS — M549 Dorsalgia, unspecified: Secondary | ICD-10-CM | POA: Diagnosis present

## 2016-12-24 DIAGNOSIS — H409 Unspecified glaucoma: Secondary | ICD-10-CM | POA: Diagnosis present

## 2016-12-24 DIAGNOSIS — K5720 Diverticulitis of large intestine with perforation and abscess without bleeding: Principal | ICD-10-CM | POA: Diagnosis present

## 2016-12-24 DIAGNOSIS — Z79899 Other long term (current) drug therapy: Secondary | ICD-10-CM

## 2016-12-24 DIAGNOSIS — K5732 Diverticulitis of large intestine without perforation or abscess without bleeding: Secondary | ICD-10-CM | POA: Diagnosis present

## 2016-12-24 DIAGNOSIS — Z7982 Long term (current) use of aspirin: Secondary | ICD-10-CM

## 2016-12-24 DIAGNOSIS — D72829 Elevated white blood cell count, unspecified: Secondary | ICD-10-CM | POA: Diagnosis present

## 2016-12-24 DIAGNOSIS — I10 Essential (primary) hypertension: Secondary | ICD-10-CM | POA: Diagnosis present

## 2016-12-24 DIAGNOSIS — E119 Type 2 diabetes mellitus without complications: Secondary | ICD-10-CM | POA: Diagnosis present

## 2016-12-24 DIAGNOSIS — K5792 Diverticulitis of intestine, part unspecified, without perforation or abscess without bleeding: Secondary | ICD-10-CM

## 2016-12-24 LAB — COMPREHENSIVE METABOLIC PANEL
ALBUMIN: 3.7 g/dL (ref 3.5–5.0)
ALT: 26 U/L (ref 17–63)
AST: 22 U/L (ref 15–41)
Alkaline Phosphatase: 56 U/L (ref 38–126)
Anion gap: 10 (ref 5–15)
BUN: 7 mg/dL (ref 6–20)
CHLORIDE: 101 mmol/L (ref 101–111)
CO2: 26 mmol/L (ref 22–32)
Calcium: 9.1 mg/dL (ref 8.9–10.3)
Creatinine, Ser: 1.12 mg/dL (ref 0.61–1.24)
GFR calc Af Amer: >60 mL/min (ref >60)
GFR calc non Af Amer: >60 mL/min (ref >60)
GLUCOSE: 154 mg/dL — AB (ref 65–99)
POTASSIUM: 4.6 mmol/L (ref 3.5–5.1)
Sodium: 137 mmol/L (ref 135–145)
Total Bilirubin: 0.9 mg/dL (ref 0.3–1.2)
Total Protein: 8.3 g/dL — ABNORMAL HIGH (ref 6.5–8.1)

## 2016-12-24 LAB — CBC
HEMATOCRIT: 45.7 % (ref 39.0–52.0)
Hemoglobin: 15.9 g/dL (ref 13.0–17.0)
MCH: 31.3 pg (ref 26.0–34.0)
MCHC: 34.8 g/dL (ref 30.0–36.0)
MCV: 90 fL (ref 78.0–100.0)
Platelets: 213 10*3/uL (ref 150–400)
RBC: 5.08 MIL/uL (ref 4.22–5.81)
RDW: 12.3 % (ref 11.5–15.5)
WBC: 14.2 10*3/uL — ABNORMAL HIGH (ref 4.0–10.5)

## 2016-12-24 LAB — URINALYSIS, ROUTINE W REFLEX MICROSCOPIC
BILIRUBIN URINE: NEGATIVE
Glucose, UA: 150 mg/dL — AB
Hgb urine dipstick: NEGATIVE
Ketones, ur: 20 mg/dL — AB
LEUKOCYTES UA: NEGATIVE
Nitrite: NEGATIVE
Protein, ur: 100 mg/dL — AB
SPECIFIC GRAVITY, URINE: 1.026 (ref 1.005–1.030)
pH: 5 (ref 5.0–8.0)

## 2016-12-24 LAB — LIPASE, BLOOD: LIPASE: 14 U/L (ref 11–51)

## 2016-12-24 MED ORDER — HYDROMORPHONE HCL 2 MG/ML IJ SOLN
1.0000 mg | Freq: Once | INTRAMUSCULAR | Status: AC
  Administered 2016-12-25: 1 mg via INTRAVENOUS
  Filled 2016-12-24: qty 1

## 2016-12-24 MED ORDER — PIPERACILLIN-TAZOBACTAM 3.375 G IVPB
3.3750 g | Freq: Once | INTRAVENOUS | Status: AC
  Administered 2016-12-25: 3.375 g via INTRAVENOUS
  Filled 2016-12-24: qty 50

## 2016-12-24 MED ORDER — IOPAMIDOL (ISOVUE-300) INJECTION 61%
INTRAVENOUS | Status: AC
  Administered 2016-12-24: 100 mL via INTRAVENOUS
  Filled 2016-12-24: qty 100

## 2016-12-24 MED ORDER — SODIUM CHLORIDE 0.9 % IV SOLN
Freq: Once | INTRAVENOUS | Status: AC
  Administered 2016-12-25: 01:00:00 via INTRAVENOUS

## 2016-12-24 NOTE — ED Triage Notes (Signed)
Pt reports lower abdominal pain that started a week ago and is getting worse. LBM today "but its been in increments and ive been having trouble going." Denies n/v/d.

## 2016-12-24 NOTE — ED Provider Notes (Signed)
[facility]-EMERGENCY DEPT Provider Note   CSN: [id number] Arrival date & time: 12/24/16  1423     History   Chief Complaint Chief Complaint  Patient presents with  . Abdominal Pain    HPI [name] is a 61 y.o. male.  The history is provided by the patient.  Abdominal Pain   This is a new problem. Episode onset: 1wk. The problem occurs constantly. The problem has been gradually worsening. The pain is located in the suprapubic region and LLQ. The pain is moderate. Associated symptoms include constipation and dysuria. Pertinent negatives include fever (has had chills), diarrhea, melena, nausea, vomiting, hematuria and headaches. The symptoms are aggravated by palpation and bowel movements.    Past Medical History:  Diagnosis Date  . Diabetes mellitus without complication (HCC)   . Hypertension   . Unspecified viral hepatitis C without hepatic coma     Patient Active Problem List   Diagnosis Date Noted  . Diabetes mellitus, type II (HCC) 09/02/2013  . Hypertension 09/02/2013  . Glaucoma 09/02/2013  . Chronic back pain 09/02/2013  . Chest pain 09/02/2013    Past Surgical History:  Procedure Laterality Date  . TUMOR REMOVAL     Behind left ear       Home Medications    Prior to Admission medications   Medication Sig Start Date End Date Taking? Authorizing Provider  aspirin EC 81 MG tablet Take 81 mg by mouth daily.   Yes Historical Provider, MD  metFORMIN (GLUCOPHAGE) 1000 MG tablet Take 1 tablet (1,000 mg total) by mouth 2 (two) times daily with a meal. 02/12/14  Yes Rodolph Bong, MD  simvastatin (ZOCOR) 40 MG tablet Take 1 tablet (40 mg total) by mouth daily. 02/12/14  Yes Rodolph Bong, MD  sitaGLIPtin (JANUVIA) 100 MG tablet Take 100 mg by mouth daily.   Yes Historical Provider, MD  timolol (TIMOPTIC) 0.5 % ophthalmic solution Place 1 drop into both eyes daily. Patient taking differently: Place 1 drop into both eyes 2 (two) times daily.  02/12/14  Yes Rodolph Bong,  MD  travoprost, benzalkonium, (TRAVATAN) 0.004 % ophthalmic solution Place 1 drop into both eyes at bedtime. 02/12/14  Yes Rodolph Bong, MD  diphenhydrAMINE (BENADRYL) 25 MG tablet Take 1 tablet (25 mg total) by mouth every 6 (six) hours. Patient not taking: Reported on 12/24/2016 06/22/15   Marisa Severin, MD  famotidine (PEPCID) 20 MG tablet Take 1 tablet (20 mg total) by mouth 2 (two) times daily. Patient not taking: Reported on 12/24/2016 06/22/15   Marisa Severin, MD  predniSONE (DELTASONE) 20 MG tablet Take 3 tablets (60 mg total) by mouth daily. Patient not taking: Reported on 12/24/2016 06/22/15   Marisa Severin, MD    Family History No family history on file.  Social History Social History  Substance Use Topics  . Smoking status: Former Smoker    Quit date: 09/02/2001  . Smokeless tobacco: Never Used  . Alcohol use Yes     Allergies   Patient has no known allergies.   Review of Systems Review of Systems  Constitutional: Positive for chills. Negative for fever (has had chills).  Respiratory: Negative for shortness of breath.   Cardiovascular: Negative for chest pain and palpitations.  Gastrointestinal: Positive for abdominal pain and constipation. Negative for abdominal distention, blood in stool, diarrhea, melena, nausea and vomiting.  Genitourinary: Positive for dysuria. Negative for hematuria.  Musculoskeletal: Negative for back pain.  Neurological: Negative for headaches.  All other systems  reviewed and are negative.    Physical Exam Updated Vital Signs BP 132/95 (BP Location: Left Arm)   Pulse 86   Temp 97.9 F (36.6 C) (Oral)   Resp 19   SpO2 95%   Physical Exam  Constitutional: He appears well-developed and well-nourished. No distress.  HENT:  Head: Normocephalic and atraumatic.  Mouth/Throat: Oropharynx is clear and moist.  Eyes: Conjunctivae are normal. No scleral icterus.  Neck: Neck supple.  Cardiovascular: Normal rate and regular rhythm.   No murmur  heard. Pulmonary/Chest: Effort normal and breath sounds normal. No respiratory distress. He has no wheezes. He has no rales.  Abdominal: Soft. There is tenderness in the suprapubic area and left lower quadrant. There is guarding (mild guarding). There is no rigidity, no rebound, no CVA tenderness, no tenderness at McBurney's point and negative Murphy's sign. No hernia.  Musculoskeletal: He exhibits no edema.  Neurological: He is alert.  Skin: Skin is warm and dry.  Psychiatric: He has a normal mood and affect.  Nursing note and vitals reviewed.    ED Treatments / Results  Labs (all labs ordered are listed, but only abnormal results are displayed) Labs Reviewed  COMPREHENSIVE METABOLIC PANEL - Abnormal; Notable for the following:       Result Value   Glucose, Bld 154 (*)    Total Protein 8.3 (*)    All other components within normal limits  CBC - Abnormal; Notable for the following:    WBC 14.2 (*)    All other components within normal limits  URINALYSIS, ROUTINE W REFLEX MICROSCOPIC - Abnormal; Notable for the following:    Color, Urine AMBER (*)    Glucose, UA 150 (*)    Ketones, ur 20 (*)    Protein, ur 100 (*)    Bacteria, UA RARE (*)    Squamous Epithelial / LPF 0-5 (*)    All other components within normal limits  LIPASE, BLOOD    EKG  EKG Interpretation None       Radiology Ct Abdomen Pelvis W Contrast  Result Date: 12/24/2016 CLINICAL DATA:  Suprapubic pain for 1 week EXAM: CT ABDOMEN AND PELVIS WITH CONTRAST TECHNIQUE: Multidetector CT imaging of the abdomen and pelvis was performed using the standard protocol following bolus administration of intravenous contrast. CONTRAST:  ISOVUE-300 IOPAMIDOL (ISOVUE-300) INJECTION 61% COMPARISON:  None. FINDINGS: Lower chest: Lung bases demonstrate no acute consolidation or pleural effusion. Heart size within normal limits. Coronary artery calcifications are present. Hepatobiliary: Subcentimeter hypodense lesion at the  dome of the liver, too small to further characterize. No biliary dilatation. No calcified gallstones Pancreas: Unremarkable. No pancreatic ductal dilatation or surrounding inflammatory changes. Spleen: Normal in size without focal abnormality. Adrenals/Urinary Tract: Adrenal glands within normal limits. Subcentimeter hypodense lesions in the right kidney, too small to further characterize. Mild thickening of the anterior wall of the bladder. Stomach/Bowel: Stomach is nonenlarged. No evidence for small bowel obstruction. Appendix normal. Multiple sigmoid colon diverticula. Focal wall thickening of the sigmoid colon with adjacent inflammation in the fat consistent with acute diverticulitis. Small extraluminal gas collections, series 301, image number 80 and series 301, image number 79 are suspect for contained perforation. No abscess. No free air. Vascular/Lymphatic: Aortic atherosclerosis. No enlarged abdominal or pelvic lymph nodes. Reproductive: Slightly enlarged prostate. Other: No free fluid.  No free air. Musculoskeletal: Degenerative changes at L5-S1. No acute or suspicious bone lesions. IMPRESSION: 1. Findings consistent with acute sigmoid colon diverticulitis. Small extraluminal gas collections adjacent to the  sigmoid colon are suspicious for contained perforation. There is no evidence for abscess. There is no free air identified. 2. Subcentimeter hypodense lesions in the liver and right kidney, too small to further characterize. Electronically Signed   By: Jasmine PangKim  Fujinaga M.D.   On: 12/24/2016 23:10    Procedures Procedures (including critical care time)  Medications Ordered in ED Medications  piperacillin-tazobactam (ZOSYN) IVPB 3.375 g (not administered)  HYDROmorphone (DILAUDID) injection 1 mg (not administered)  0.9 %  sodium chloride infusion (not administered)  iopamidol (ISOVUE-300) 61 % injection (100 mLs Intravenous Contrast Given 12/24/16 2230)     Initial Impression / Assessment and  Plan / ED Course  I have reviewed the triage vital signs and the nursing notes.  Pertinent labs & imaging results that were available during my care of the patient were reviewed by me and considered in my medical decision making (see chart for details).  Clinical Course    Patient is a 61 year old male with history of diabetes, hypertension who presents with 1 week of worsening suprapubic and left lower quadrant pain. Patient states he has been increasingly constipated. Patient denies any blood or melanotic stools. Patient has had on and off chills during this last week. Next  On exam patient has significant tenderness in the left lower quadrant and suprapubic area. No generalized peritonitis. Labs obtained significant for leukocytosis 14.2, negative UA. Concern for possible diverticulitis so CT obtained. Imaging shows acute sigmoid diverticulitis with extraluminal air adjacent to the diverticulitis suspicious for perforation. No abscess identified.  Patient started on Zosyn. General surgery is consulted for evaluation and admission for complicated diverticulitis. Patient was wanting to leave AMA but he was able to be convinced to stay due to his current condition. Pt started on NS infusion, made NPO and given dilaudid for pain.  Pt seen with attending Dr. Lynelle DoctorKnapp.  Final Clinical Impressions(s) / ED Diagnoses   Final diagnoses:  Acute diverticulitis    New Prescriptions New Prescriptions   No medications on file     Dwana MelenaRobin Jannice Beitzel, DO 12/24/16 2357    Linwood DibblesJon Knapp, MD 12/26/16 [phone]

## 2016-12-24 NOTE — ED Notes (Signed)
Pt st;'s he has had suprapublic pain for approx 1 week, st's pain became worse today

## 2016-12-25 DIAGNOSIS — Z87891 Personal history of nicotine dependence: Secondary | ICD-10-CM | POA: Diagnosis not present

## 2016-12-25 DIAGNOSIS — G8929 Other chronic pain: Secondary | ICD-10-CM | POA: Diagnosis present

## 2016-12-25 DIAGNOSIS — Z7982 Long term (current) use of aspirin: Secondary | ICD-10-CM | POA: Diagnosis not present

## 2016-12-25 DIAGNOSIS — H409 Unspecified glaucoma: Secondary | ICD-10-CM | POA: Diagnosis present

## 2016-12-25 DIAGNOSIS — K5720 Diverticulitis of large intestine with perforation and abscess without bleeding: Secondary | ICD-10-CM | POA: Diagnosis not present

## 2016-12-25 DIAGNOSIS — K5732 Diverticulitis of large intestine without perforation or abscess without bleeding: Secondary | ICD-10-CM | POA: Diagnosis present

## 2016-12-25 DIAGNOSIS — E119 Type 2 diabetes mellitus without complications: Secondary | ICD-10-CM | POA: Diagnosis present

## 2016-12-25 DIAGNOSIS — Z79899 Other long term (current) drug therapy: Secondary | ICD-10-CM | POA: Diagnosis not present

## 2016-12-25 DIAGNOSIS — M549 Dorsalgia, unspecified: Secondary | ICD-10-CM | POA: Diagnosis present

## 2016-12-25 DIAGNOSIS — Z7984 Long term (current) use of oral hypoglycemic drugs: Secondary | ICD-10-CM | POA: Diagnosis not present

## 2016-12-25 DIAGNOSIS — D72829 Elevated white blood cell count, unspecified: Secondary | ICD-10-CM | POA: Diagnosis present

## 2016-12-25 DIAGNOSIS — B1920 Unspecified viral hepatitis C without hepatic coma: Secondary | ICD-10-CM | POA: Diagnosis present

## 2016-12-25 DIAGNOSIS — I10 Essential (primary) hypertension: Secondary | ICD-10-CM | POA: Diagnosis present

## 2016-12-25 DIAGNOSIS — R1032 Left lower quadrant pain: Secondary | ICD-10-CM | POA: Diagnosis present

## 2016-12-25 HISTORY — DX: Diverticulitis of large intestine without perforation or abscess without bleeding: K57.32

## 2016-12-25 LAB — CBC
HEMATOCRIT: 42.1 % (ref 39.0–52.0)
Hemoglobin: 14.2 g/dL (ref 13.0–17.0)
MCH: 30.4 pg (ref 26.0–34.0)
MCHC: 33.7 g/dL (ref 30.0–36.0)
MCV: 90.1 fL (ref 78.0–100.0)
Platelets: 206 10*3/uL (ref 150–400)
RBC: 4.67 MIL/uL (ref 4.22–5.81)
RDW: 12.5 % (ref 11.5–15.5)
WBC: 9 10*3/uL (ref 4.0–10.5)

## 2016-12-25 LAB — BASIC METABOLIC PANEL
Anion gap: 9 (ref 5–15)
BUN: 8 mg/dL (ref 6–20)
CO2: 26 mmol/L (ref 22–32)
Calcium: 8.6 mg/dL — ABNORMAL LOW (ref 8.9–10.3)
Chloride: 102 mmol/L (ref 101–111)
Creatinine, Ser: 1.02 mg/dL (ref 0.61–1.24)
GFR calc Af Amer: >60 mL/min (ref >60)
GLUCOSE: 116 mg/dL — AB (ref 65–99)
POTASSIUM: 3.9 mmol/L (ref 3.5–5.1)
Sodium: 137 mmol/L (ref 135–145)

## 2016-12-25 LAB — GLUCOSE, CAPILLARY
GLUCOSE-CAPILLARY: 110 mg/dL — AB (ref 65–99)
GLUCOSE-CAPILLARY: 109 mg/dL — AB (ref 65–99)
GLUCOSE-CAPILLARY: 93 mg/dL (ref 65–99)
Glucose-Capillary: 119 mg/dL — ABNORMAL HIGH (ref 65–99)
Glucose-Capillary: 105 mg/dL — ABNORMAL HIGH (ref 65–99)

## 2016-12-25 MED ORDER — PANTOPRAZOLE SODIUM 40 MG IV SOLR
40.0000 mg | Freq: Every day | INTRAVENOUS | Status: DC
  Administered 2016-12-25 – 2016-12-26 (×2): 40 mg via INTRAVENOUS
  Filled 2016-12-25 (×2): qty 40

## 2016-12-25 MED ORDER — ACETAMINOPHEN 325 MG PO TABS: 650.0000 mg | ORAL_TABLET | Freq: Four times a day (QID) | ORAL | Status: DC | PRN

## 2016-12-25 MED ORDER — INSULIN ASPART 100 UNIT/ML ~~LOC~~ SOLN
0.0000 [IU] | SUBCUTANEOUS | Status: DC
  Administered 2016-12-26: 5 [IU] via SUBCUTANEOUS
  Administered 2016-12-27: 3 [IU] via SUBCUTANEOUS

## 2016-12-25 MED ORDER — MORPHINE SULFATE (PF) 2 MG/ML IV SOLN
1.0000 mg | INTRAVENOUS | Status: DC | PRN
  Administered 2016-12-25 – 2016-12-27 (×9): 2 mg via INTRAVENOUS
  Filled 2016-12-25 (×9): qty 1

## 2016-12-25 MED ORDER — ACETAMINOPHEN 650 MG RE SUPP: 650.0000 mg | Freq: Four times a day (QID) | RECTAL | Status: DC | PRN

## 2016-12-25 MED ORDER — ONDANSETRON 4 MG PO TBDP
4.0000 mg | ORAL_TABLET | Freq: Four times a day (QID) | ORAL | Status: DC | PRN
Start: 2016-12-25 — End: 2016-12-27

## 2016-12-25 MED ORDER — ONDANSETRON HCL 4 MG/2ML IJ SOLN: 4.0000 mg | Freq: Four times a day (QID) | INTRAMUSCULAR | Status: DC | PRN

## 2016-12-25 MED ORDER — DIPHENHYDRAMINE HCL 12.5 MG/5ML PO ELIX: 12.5000 mg | ORAL_SOLUTION | Freq: Four times a day (QID) | ORAL | Status: DC | PRN

## 2016-12-25 MED ORDER — TRAVOPROST (BAK FREE) 0.004 % OP SOLN
1.0000 [drp] | Freq: Every day | OPHTHALMIC | Status: DC
  Administered 2016-12-25 – 2016-12-26 (×2): 1 [drp] via OPHTHALMIC
  Filled 2016-12-25: qty 2.5

## 2016-12-25 MED ORDER — PIPERACILLIN-TAZOBACTAM 3.375 G IVPB
3.3750 g | Freq: Three times a day (TID) | INTRAVENOUS | Status: DC
  Administered 2016-12-25 – 2016-12-27 (×7): 3.375 g via INTRAVENOUS
  Filled 2016-12-25 (×8): qty 50

## 2016-12-25 MED ORDER — DIPHENHYDRAMINE HCL 50 MG/ML IJ SOLN: 12.5000 mg | Freq: Four times a day (QID) | INTRAMUSCULAR | Status: DC | PRN

## 2016-12-25 MED ORDER — ENOXAPARIN SODIUM 40 MG/0.4ML ~~LOC~~ SOLN
40.0000 mg | SUBCUTANEOUS | Status: DC
  Filled 2016-12-25 (×2): qty 0.4

## 2016-12-25 MED ORDER — SODIUM CHLORIDE 0.9 % IV SOLN
INTRAVENOUS | Status: DC
  Administered 2016-12-25 – 2016-12-27 (×4): via INTRAVENOUS

## 2016-12-25 MED ORDER — TIMOLOL MALEATE 0.5 % OP SOLN
1.0000 [drp] | Freq: Two times a day (BID) | OPHTHALMIC | Status: DC
  Administered 2016-12-25 – 2016-12-27 (×5): 1 [drp] via OPHTHALMIC
  Filled 2016-12-25 (×2): qty 5

## 2016-12-25 MED ORDER — PROMETHAZINE HCL 25 MG/ML IJ SOLN: 12.5000 mg | Freq: Four times a day (QID) | INTRAMUSCULAR | Status: DC | PRN

## 2016-12-25 NOTE — H&P (Signed)
Collins Koskela is an 61 y.o. male.   Chief Complaint: abdominal pain HPI: 61 year old male with diabetes, hypertension presents with one-week history of left lower quadrant suprapubic abdominal pain. He states the pain has been constant but will increase at certain times. He denies any prior episodes. He came to the emergency room for worsening severe pain. He reports some subjective chills. But denies fevers, nausea or vomiting. He denies any stool caliber changes. He has had some difficulty evacuating and reports smaller bowel movements. He reports he is due for colonoscopy. He had a colonoscopy about 3 years ago and states he had polyps and received a recall letter recently. He does not smoke. He denies any melena or hematochezia. He denies any pneumaturia. He does report some mild dysuria. He reports some planned weight loss.  Past Medical History:  Diagnosis Date  . Diabetes mellitus without complication ([place])   . Hypertension   . 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 TUMOR REMOVAL     Behind left ear    No family history on file. Social History:  reports that he quit smoking about 15 years ago. He has never used smokeless tobacco. He reports that he drinks alcohol. He reports that he does not use drugs.  Allergies: No Known Allergies   (Not in a hospital admission)  Results for orders placed or performed during the hospital encounter of 12/24/16 (from the past 48 hour(s))  Lipase, blood     Status: None   Collection Time: 12/24/16  3:35 PM  Result Value Ref Range   Lipase 14 11 - 51 U/L  Comprehensive metabolic panel     Status: Abnormal   Collection Time: 12/24/16  3:35 PM  Result Value Ref Range   Sodium 137 135 - 145 mmol/L   Potassium 4.6 3.5 - 5.1 mmol/L   Chloride 101 101 - 111 mmol/L   CO2 26 22 - 32 mmol/L   Glucose, Bld 154 (H) 65 - 99 mg/dL   BUN 7 6 - 20 mg/dL   Creatinine, Ser 1.12 0.61 -  1.24 mg/dL   Calcium 9.1 8.9 - 10.3 mg/dL   Total Protein 8.3 (H) 6.5 - 8.1 g/dL   Albumin 3.7 3.5 - 5.0 g/dL   AST 22 15 - 41 U/L   ALT 26 17 - 63 U/L   Alkaline Phosphatase 56 38 - 126 U/L   Total Bilirubin 0.9 0.3 - 1.2 mg/dL   GFR calc non Af Amer >60 >60 mL/min   GFR calc Af Amer >60 >60 mL/min    Comment: (NOTE) The eGFR has been calculated using the CKD EPI equation. This calculation has not been validated in all clinical situations. eGFR's persistently <60 mL/min signify possible Chronic Kidney Disease.    Anion gap 10 5 - 15  CBC     Status: Abnormal   Collection Time: 12/24/16  3:35 PM  Result Value Ref Range   WBC 14.2 (H) 4.0 - 10.5 K/uL   RBC 5.08 4.22 - 5.81 MIL/uL   Hemoglobin 15.9 13.0 - 17.0 g/dL   HCT 45.7 39.0 - 52.0 %   MCV 90.0 78.0 - 100.0 fL   MCH 31.3 26.0 - 34.0 pg   MCHC 34.8 30.0 - 36.0 g/dL   RDW 12.3 11.5 - 15.5 %   Platelets 213 150 - 400 K/uL  Urinalysis, Routine w reflex microscopic     Status: Abnormal  Collection Time: 12/24/16  9:11 PM  Result Value Ref Range   Color, Urine AMBER (A) YELLOW    Comment: BIOCHEMICALS MAY BE AFFECTED BY COLOR   APPearance CLEAR CLEAR   Specific Gravity, Urine 1.026 1.005 - 1.030   pH 5.0 5.0 - 8.0   Glucose, UA 150 (A) NEGATIVE mg/dL   Hgb urine dipstick NEGATIVE NEGATIVE   Bilirubin Urine NEGATIVE NEGATIVE   Ketones, ur 20 (A) NEGATIVE mg/dL   Protein, ur 100 (A) NEGATIVE mg/dL   Nitrite NEGATIVE NEGATIVE   Leukocytes, UA NEGATIVE NEGATIVE   RBC / HPF 0-5 0 - 5 RBC/hpf   WBC, UA 0-5 0 - 5 WBC/hpf   Bacteria, UA RARE (A) NONE SEEN   Squamous Epithelial / LPF 0-5 (A) NONE SEEN   Mucous PRESENT    Ct Abdomen Pelvis W Contrast  Result Date: 12/24/2016 CLINICAL DATA:  Suprapubic pain for 1 week EXAM: CT ABDOMEN AND PELVIS WITH CONTRAST TECHNIQUE: Multidetector CT imaging of the abdomen and pelvis was performed using the standard protocol following bolus administration of intravenous contrast. CONTRAST:   163m ISOVUE-300 IOPAMIDOL (ISOVUE-300) INJECTION 61% COMPARISON:  None. FINDINGS: Lower chest: Lung bases demonstrate no acute consolidation or pleural effusion. Heart size within normal limits. Coronary artery calcifications are present. Hepatobiliary: Subcentimeter hypodense lesion at the dome of the liver, too small to further characterize. No biliary dilatation. No calcified gallstones Pancreas: Unremarkable. No pancreatic ductal dilatation or surrounding inflammatory changes. Spleen: Normal in size without focal abnormality. Adrenals/Urinary Tract: Adrenal glands within normal limits. Subcentimeter hypodense lesions in the right kidney, too small to further characterize. Mild thickening of the anterior wall of the bladder. Stomach/Bowel: Stomach is nonenlarged. No evidence for small bowel obstruction. Appendix normal. Multiple sigmoid colon diverticula. Focal wall thickening of the sigmoid colon with adjacent inflammation in the fat consistent with acute diverticulitis. Small extraluminal gas collections, series 301, image number 80 and series 301, image number 79 are suspect for contained perforation. No abscess. No free air. Vascular/Lymphatic: Aortic atherosclerosis. No enlarged abdominal or pelvic lymph nodes. Reproductive: Slightly enlarged prostate. Other: No free fluid.  No free air. Musculoskeletal: Degenerative changes at L5-S1. No acute or suspicious bone lesions. IMPRESSION: 1. Findings consistent with acute sigmoid colon diverticulitis. Small extraluminal gas collections adjacent to the sigmoid colon are suspicious for contained perforation. There is no evidence for abscess. There is no free air identified. 2. Subcentimeter hypodense lesions in the liver and right kidney, too small to further characterize. Electronically Signed   By: KDonavan FoilM.D.   On: 12/24/2016 23:10    Review of Systems  Constitutional: Positive for chills. Negative for weight loss.  HENT: Negative for nosebleeds.     Eyes: Negative for blurred vision.  Respiratory: Negative for shortness of breath.   Cardiovascular: Negative for chest pain, palpitations, orthopnea and PND.       Denies DOE  Gastrointestinal: Positive for abdominal pain. Negative for blood in stool, melena, nausea and vomiting.  Genitourinary: Negative for dysuria and hematuria.       Some hesistancy  Musculoskeletal: Negative.   Skin: Negative for itching and rash.  Neurological: Negative for dizziness, focal weakness, seizures, loss of consciousness and headaches.       Denies TIAs, amaurosis fugax  Endo/Heme/Allergies: Does not bruise/bleed easily.  Psychiatric/Behavioral: The patient is not nervous/anxious.     Blood pressure 132/95, pulse 86, temperature 97.9 F (36.6 C), temperature source Oral, resp. rate 19, SpO2 95 %. Physical Exam  Vitals  reviewed. Constitutional: He is oriented to person, place, and time. He appears well-developed and well-nourished. No distress.  Sitting up in stretcher speaking with family member  HENT:  Head: Normocephalic and atraumatic.  Right Ear: External ear normal.  Left Ear: External ear normal.  Eyes: Conjunctivae are normal. No scleral icterus.  Neck: Normal range of motion. Neck supple. No tracheal deviation present. No thyromegaly present.  Cardiovascular: Normal rate and normal heart sounds.   Respiratory: Effort normal and breath sounds normal. No stridor. No respiratory distress. He has no wheezes.  GI: Soft. He exhibits no distension. There is tenderness in the suprapubic area and left lower quadrant. There is no rebound and no guarding.    Suprapubic greater than left lower quadrant tenderness palpation. No rebound or peritonitis  Musculoskeletal: He exhibits no edema or tenderness.  Lymphadenopathy:    He has no cervical adenopathy.  Neurological: He is alert and oriented to person, place, and time. He exhibits normal muscle tone.  Skin: Skin is warm and dry. No rash noted. He  is not diaphoretic. No erythema. No pallor.  Psychiatric: He has a normal mood and affect. His behavior is normal. Judgment and thought content normal.     Assessment/Plan Sigmoid diverticulitis with micro-perforation Hypertension Diabetes mellitus  Admit to service, bowel rest, IV fluids, IV antibiotics Chemical DVT prophylaxis Sliding scale insulin  Discussed diverticular disease with patient and family member. Discussed management of diverticulitis. Discussed the likely hospital course. Did discuss the possibility of his diverticulitis worsening possibly requiring repeat CT scan to evaluate for formation of abscess or failure of medical management requiring operative intervention.  I believe we can start with nonsurgical manage. He is afebrile and not tachycardic and is resting comfortably. He appears to have a contained micro-perforation.  Did inform the patient is found member that if he is successful with nonoperative management and he will need an outpatient colonoscopy in 6-8 weeks  Leighton Ruff. Redmond Pulling, MD, [place] B[facility] Minimally In[facility], [place]   Gayland Curry, MD 12/25/2016, 12:22 AM

## 2016-12-25 NOTE — ED Notes (Signed)
Dr. Andrey CampanileWilson in to assess pt at this time

## 2016-12-25 NOTE — Progress Notes (Signed)
Subjective: He had some pain this AM, better with pain meds.  No nausea or vomiting.    Objective: Vital signs in last 24 hours: Temp:  [97.9 F (36.6 C)-99.3 F (37.4 C)] 98.2 F (36.8 C) (01/09 0416) Pulse Rate:  [65-87] 65 (01/09 0416) Resp:  [16-19] 17 (01/09 0416) BP: (121-132)/(79-99) 121/79 (01/09 0416) SpO2:  [95 %-100 %] 99 % (01/09 0416) Weight:  [85.7 kg (188 lb 14.4 oz)] 85.7 kg (188 lb 14.4 oz) (01/09 0200) Last BM Date: 12/23/16 Nothing on I/O  Afebrile, VSS No labs this AM CT scan 12/24/16:  Findings consistent with acute sigmoid colon diverticulitis. Small extraluminal gas collections adjacent to the sigmoid colon are suspicious for contained perforation. There is no evidence for abscess. There is no free air identified. 2. Subcentimeter hypodense lesions in the liver and right kidney, too small to further characterize.  Intake/Output from previous day: No intake/output data recorded. Intake/Output this shift: No intake/output data recorded.  General appearance: alert, cooperative and no distress GI: soft, still a little tender, better with pain meds.  Lab Results:   Recent Labs  12/24/16 1535  WBC 14.2*  HGB 15.9  HCT 45.7  PLT 213    BMET  Recent Labs  12/24/16 1535  NA 137  K 4.6  CL 101  CO2 26  GLUCOSE 154*  BUN 7  CREATININE 1.12  CALCIUM 9.1   PT/INR No results for input(s): LABPROT, INR in the last 72 hours.   Recent Labs Lab 12/24/16 1535  AST 22  ALT 26  ALKPHOS 56  BILITOT 0.9  PROT 8.3*  ALBUMIN 3.7     Lipase     Component Value Date/Time   LIPASE 14 12/24/2016 1535     Studies/Results: Ct Abdomen Pelvis W Contrast  Result Date: 12/24/2016 CLINICAL DATA:  Suprapubic pain for 1 week EXAM: CT ABDOMEN AND PELVIS WITH CONTRAST TECHNIQUE: Multidetector CT imaging of the abdomen and pelvis was performed using the standard protocol following bolus administration of intravenous contrast. CONTRAST:  100mL  ISOVUE-300 IOPAMIDOL (ISOVUE-300) INJECTION 61% COMPARISON:  None. FINDINGS: Lower chest: Lung bases demonstrate no acute consolidation or pleural effusion. Heart size within normal limits. Coronary artery calcifications are present. Hepatobiliary: Subcentimeter hypodense lesion at the dome of the liver, too small to further characterize. No biliary dilatation. No calcified gallstones Pancreas: Unremarkable. No pancreatic ductal dilatation or surrounding inflammatory changes. Spleen: Normal in size without focal abnormality. Adrenals/Urinary Tract: Adrenal glands within normal limits. Subcentimeter hypodense lesions in the right kidney, too small to further characterize. Mild thickening of the anterior wall of the bladder. Stomach/Bowel: Stomach is nonenlarged. No evidence for small bowel obstruction. Appendix normal. Multiple sigmoid colon diverticula. Focal wall thickening of the sigmoid colon with adjacent inflammation in the fat consistent with acute diverticulitis. Small extraluminal gas collections, series 301, image number 80 and series 301, image number 79 are suspect for contained perforation. No abscess. No free air. Vascular/Lymphatic: Aortic atherosclerosis. No enlarged abdominal or pelvic lymph nodes. Reproductive: Slightly enlarged prostate. Other: No free fluid.  No free air. Musculoskeletal: Degenerative changes at L5-S1. No acute or suspicious bone lesions. IMPRESSION: 1. Findings consistent with acute sigmoid colon diverticulitis. Small extraluminal gas collections adjacent to the sigmoid colon are suspicious for contained perforation. There is no evidence for abscess. There is no free air identified. 2. Subcentimeter hypodense lesions in the liver and right kidney, too small to further characterize. Electronically Signed   By: Jasmine PangKim  Fujinaga M.D.   On:  12/24/2016 23:10   Prior to Admission medications   Medication Sig Start Date End Date Taking? Authorizing Provider  aspirin EC 81 MG tablet  Take 81 mg by mouth daily.   Yes Historical Provider, MD  metFORMIN (GLUCOPHAGE) 1000 MG tablet Take 1 tablet (1,000 mg total) by mouth 2 (two) times daily with a meal. 02/12/14  Yes Rodolph Bong, MD  simvastatin (ZOCOR) 40 MG tablet Take 1 tablet (40 mg total) by mouth daily. 02/12/14  Yes Rodolph Bong, MD  sitaGLIPtin (JANUVIA) 100 MG tablet Take 100 mg by mouth daily.   Yes Historical Provider, MD  timolol (TIMOPTIC) 0.5 % ophthalmic solution Place 1 drop into both eyes daily. Patient taking differently: Place 1 drop into both eyes 2 (two) times daily.  02/12/14  Yes Rodolph Bong, MD  travoprost, benzalkonium, (TRAVATAN) 0.004 % ophthalmic solution Place 1 drop into both eyes at bedtime. 02/12/14  Yes Rodolph Bong, MD  diphenhydrAMINE (BENADRYL) 25 MG tablet Take 1 tablet (25 mg total) by mouth every 6 (six) hours. Patient not taking: Reported on 12/24/2016 06/22/15   Marisa Severin, MD  famotidine (PEPCID) 20 MG tablet Take 1 tablet (20 mg total) by mouth 2 (two) times daily. Patient not taking: Reported on 12/24/2016 06/22/15   Marisa Severin, MD  predniSONE (DELTASONE) 20 MG tablet Take 3 tablets (60 mg total) by mouth daily. Patient not taking: Reported on 12/24/2016 06/22/15   Marisa Severin, MD    Medications: . enoxaparin (LOVENOX) injection  40 mg Subcutaneous Q24H  . insulin aspart  0-15 Units Subcutaneous Q4H  . pantoprazole (PROTONIX) IV  40 mg Intravenous QHS  . piperacillin-tazobactam (ZOSYN)  IV  3.375 g Intravenous Q8H  . timolol  1 drop Both Eyes BID  . Travoprost (BAK Free)  1 drop Both Eyes QHS   . sodium chloride 100 mL/hr at 12/25/16 0534    Assessment/Plan  Sigmoid diverticulitis with microperforation Hypertension Diabetes Type II FEN:  IV fluids/NPO ID:  Zosyn 12/24/16 =>> day 2 DVT:  Lovenox  Plan: Continue NPO except ice chips,  & antibiotics today.     LOS: 0 days    Caron Tardif 12/25/2016 [phone]

## 2016-12-25 NOTE — Progress Notes (Signed)
Patient admitted from ED with diverticulitis. Alert and oriented x4. VS stable. Oriented to room and call bell. Denies pain at this time. Wife at bedside.

## 2016-12-26 LAB — CBC
HCT: 40.1 % (ref 39.0–52.0)
Hemoglobin: 13.4 g/dL (ref 13.0–17.0)
MCH: 30.2 pg (ref 26.0–34.0)
MCHC: 33.4 g/dL (ref 30.0–36.0)
MCV: 90.5 fL (ref 78.0–100.0)
PLATELETS: 203 10*3/uL (ref 150–400)
RBC: 4.43 MIL/uL (ref 4.22–5.81)
RDW: 12.4 % (ref 11.5–15.5)
WBC: 7.8 10*3/uL (ref 4.0–10.5)

## 2016-12-26 LAB — GLUCOSE, CAPILLARY
GLUCOSE-CAPILLARY: 105 mg/dL — AB (ref 65–99)
GLUCOSE-CAPILLARY: 193 mg/dL — AB (ref 65–99)
GLUCOSE-CAPILLARY: 86 mg/dL (ref 65–99)
GLUCOSE-CAPILLARY: 96 mg/dL (ref 65–99)
Glucose-Capillary: 80 mg/dL (ref 65–99)
Glucose-Capillary: 87 mg/dL (ref 65–99)
Glucose-Capillary: 215 mg/dL — ABNORMAL HIGH (ref 65–99)

## 2016-12-26 LAB — BASIC METABOLIC PANEL
Anion gap: 9 (ref 5–15)
BUN: 8 mg/dL (ref 6–20)
CO2: 25 mmol/L (ref 22–32)
Calcium: 8.5 mg/dL — ABNORMAL LOW (ref 8.9–10.3)
Chloride: 103 mmol/L (ref 101–111)
Creatinine, Ser: 1.23 mg/dL (ref 0.61–1.24)
Glucose, Bld: 83 mg/dL (ref 65–99)
POTASSIUM: 4.5 mmol/L (ref 3.5–5.1)
SODIUM: 137 mmol/L (ref 135–145)

## 2016-12-26 LAB — HEMOGLOBIN A1C
Hgb A1c MFr Bld: 8.1 % — ABNORMAL HIGH (ref 4.8–5.6)
MEAN PLASMA GLUCOSE: 186 mg/dL

## 2016-12-26 NOTE — Progress Notes (Signed)
Subjective: He feels better this a.m. no pain. Pain was primarily in the midportion of his abdomen yesterday. He is feeling hungry.  Objective: Vital signs in last 24 hours: Temp:  [97.8 F (36.6 C)-98.3 F (36.8 C)] 97.8 F (36.6 C) (01/10 0445) Pulse Rate:  [59-62] 62 (01/10 0445) Resp:  [18-19] 18 (01/10 0445) BP: (109-118)/(69-73) 113/73 (01/10 0445) SpO2:  [97 %-100 %] 99 % (01/10 0445) Last BM Date: 12/23/16 Nothing by mouth 2500 IV Voided 5 Afebrile, VSS CBC is normal WBC 7.8. BMP is normal creatinine is trending up. 1.02=>> 1.23 Intake/Output from previous day: 01/09 0701 - 01/10 0700 In: 2522.3 [I.V.:2422.3; IV Piggyback:100] Out: 0  Intake/Output this shift: No intake/output data recorded.  General appearance: alert, cooperative and no distress Resp: clear to auscultation bilaterally GI: soft, non-tender; bowel sounds normal; no masses,  no organomegaly  Lab Results:   Recent Labs  12/25/16 0758 12/26/16 0334  WBC 9.0 7.8  HGB 14.2 13.4  HCT 42.1 40.1  PLT 206 203    BMET  Recent Labs  12/25/16 0758 12/26/16 0334  NA 137 137  K 3.9 4.5  CL 102 103  CO2 26 25  GLUCOSE 116* 83  BUN 8 8  CREATININE 1.02 1.23  CALCIUM 8.6* 8.5*   PT/INR No results for input(s): LABPROT, INR in the last 72 hours.   Recent Labs Lab 12/24/16 1535  AST 22  ALT 26  ALKPHOS 56  BILITOT 0.9  PROT 8.3*  ALBUMIN 3.7     Lipase     Component Value Date/Time   LIPASE 14 12/24/2016 1535     Studies/Results: Ct Abdomen Pelvis W Contrast  Result Date: 12/24/2016 CLINICAL DATA:  Suprapubic pain for 1 week EXAM: CT ABDOMEN AND PELVIS WITH CONTRAST TECHNIQUE: Multidetector CT imaging of the abdomen and pelvis was performed using the standard protocol following bolus administration of intravenous contrast. CONTRAST:  ISOVUE-300 IOPAMIDOL (ISOVUE-300) INJECTION 61% COMPARISON:  None. FINDINGS: Lower chest: Lung bases demonstrate no acute consolidation or  pleural effusion. Heart size within normal limits. Coronary artery calcifications are present. Hepatobiliary: Subcentimeter hypodense lesion at the dome of the liver, too small to further characterize. No biliary dilatation. No calcified gallstones Pancreas: Unremarkable. No pancreatic ductal dilatation or surrounding inflammatory changes. Spleen: Normal in size without focal abnormality. Adrenals/Urinary Tract: Adrenal glands within normal limits. Subcentimeter hypodense lesions in the right kidney, too small to further characterize. Mild thickening of the anterior wall of the bladder. Stomach/Bowel: Stomach is nonenlarged. No evidence for small bowel obstruction. Appendix normal. Multiple sigmoid colon diverticula. Focal wall thickening of the sigmoid colon with adjacent inflammation in the fat consistent with acute diverticulitis. Small extraluminal gas collections, series 301, image number 80 and series 301, image number 79 are suspect for contained perforation. No abscess. No free air. Vascular/Lymphatic: Aortic atherosclerosis. No enlarged abdominal or pelvic lymph nodes. Reproductive: Slightly enlarged prostate. Other: No free fluid.  No free air. Musculoskeletal: Degenerative changes at L5-S1. No acute or suspicious bone lesions. IMPRESSION: 1. Findings consistent with acute sigmoid colon diverticulitis. Small extraluminal gas collections adjacent to the sigmoid colon are suspicious for contained perforation. There is no evidence for abscess. There is no free air identified. 2. Subcentimeter hypodense lesions in the liver and right kidney, too small to further characterize. Electronically Signed   By: Jasmine Pang M.D.   On: 12/24/2016 23:10    Medications: . enoxaparin (LOVENOX) injection  40 mg Subcutaneous Q24H  . insulin aspart  0-15  Units Subcutaneous Q4H  . pantoprazole (PROTONIX) IV  40 mg Intravenous QHS  . piperacillin-tazobactam (ZOSYN)  IV  3.375 g Intravenous Q8H  . timolol  1 drop Both  Eyes BID  . Travoprost (BAK Free)  1 drop Both Eyes QHS   . sodium chloride 100 mL/hr at 12/25/16 2221    Assessment/Plan Sigmoid diverticulitis with microperforation Hypertension Diabetes Type II FEN:  IV fluids/NPO ID:  Zosyn 12/24/16 =>> day 3 DVT:  Lovenox  Plan: Continue antibiotics and start clear liquids. Increase IV fluids and recheck BMP tomorrow.    .  LOS: 1 day    [place] 12/26/2016 [phone]

## 2016-12-27 LAB — BASIC METABOLIC PANEL
ANION GAP: 7 (ref 5–15)
BUN: 6 mg/dL (ref 6–20)
CHLORIDE: 109 mmol/L (ref 101–111)
CO2: 26 mmol/L (ref 22–32)
Calcium: 8.4 mg/dL — ABNORMAL LOW (ref 8.9–10.3)
Creatinine, Ser: 1.08 mg/dL (ref 0.61–1.24)
GFR calc Af Amer: >60 mL/min (ref >60)
GFR calc non Af Amer: >60 mL/min (ref >60)
Glucose, Bld: 107 mg/dL — ABNORMAL HIGH (ref 65–99)
Potassium: 4.1 mmol/L (ref 3.5–5.1)
SODIUM: 142 mmol/L (ref 135–145)

## 2016-12-27 LAB — CBC
HCT: 41 % (ref 39.0–52.0)
Hemoglobin: 13.6 g/dL (ref 13.0–17.0)
MCH: 29.9 pg (ref 26.0–34.0)
MCHC: 33.2 g/dL (ref 30.0–36.0)
MCV: 90.1 fL (ref 78.0–100.0)
PLATELETS: 221 10*3/uL (ref 150–400)
RBC: 4.55 MIL/uL (ref 4.22–5.81)
RDW: 12.7 % (ref 11.5–15.5)
WBC: 6.1 10*3/uL (ref 4.0–10.5)

## 2016-12-27 LAB — GLUCOSE, CAPILLARY
GLUCOSE-CAPILLARY: 110 mg/dL — AB (ref 65–99)
GLUCOSE-CAPILLARY: 151 mg/dL — AB (ref 65–99)
Glucose-Capillary: 103 mg/dL — ABNORMAL HIGH (ref 65–99)

## 2016-12-27 MED ORDER — SACCHAROMYCES BOULARDII 250 MG PO CAPS: 250.0000 mg | ORAL_CAPSULE | Freq: Two times a day (BID) | ORAL | Status: DC

## 2016-12-27 MED ORDER — ACETAMINOPHEN-CODEINE #3 300-30 MG PO TABS: 1.0000 | ORAL_TABLET | ORAL | Status: DC | PRN

## 2016-12-27 MED ORDER — SACCHAROMYCES BOULARDII 250 MG PO CAPS
250.0000 mg | ORAL_CAPSULE | Freq: Two times a day (BID) | ORAL | Status: DC
  Administered 2016-12-27: 250 mg via ORAL
  Filled 2016-12-27: qty 1

## 2016-12-27 MED ORDER — AMOXICILLIN-POT CLAVULANATE 875-125 MG PO TABS: 1.0000 | ORAL_TABLET | Freq: Two times a day (BID) | ORAL | 0 refills | Status: AC

## 2016-12-27 NOTE — Discharge Instructions (Signed)
Low-Fiber Diet °Fiber is found in fruits, vegetables, and whole grains. A low-fiber diet restricts fibrous foods that are not digested in the small intestine. A diet containing about 10-15 grams of fiber per day is considered low fiber. Low-fiber diets may be used to: °· Promote healing and rest the bowel during intestinal flare-ups. °· Prevent blockage of a partially obstructed or narrowed gastrointestinal tract. °· Reduce fecal weight and volume. °· Slow the movement of feces. ° °You may be on a low-fiber diet as a transitional diet following surgery, after an injury (trauma), or because of a short (acute) or lifelong (chronic) illness. Your health care provider will determine the length of time you need to stay on this diet. °What do I need to know about a low-fiber diet? °Always check the fiber content on the packaging's Nutrition Facts label, especially on foods from the grains list. Ask your dietitian if you have questions about specific foods that are related to your condition, especially if the food is not listed below. In general, a low-fiber food will have less than 2 g of fiber. °What foods can I eat? °Grains °All breads and crackers made with white flour. Sweet rolls, doughnuts, waffles, pancakes, French toast, bagels. Pretzels, Melba toast, zwieback. Well-cooked cereals, such as cornmeal, farina, or cream cereals. Dry cereals that do not contain whole grains, fruit, or nuts, such as refined corn, wheat, rice, and oat cereals. Potatoes prepared any way without skins, plain pastas and noodles, refined white rice. Use white flour for baking and making sauces. Use allowed list of grains for casseroles, dumplings, and puddings. °Vegetables °Strained tomato and vegetable juices. Fresh lettuce, cucumber, spinach. Well-cooked (no skin or pulp) or canned vegetables, such as asparagus, bean sprouts, beets, carrots, green beans, mushrooms, potatoes, pumpkin, spinach, yellow squash, tomato sauce/puree, turnips,  yams, and zucchini. Keep servings limited to ½ cup. °Fruits °All fruit juices except prune juice. Cooked or canned fruits without skin and seeds, such as applesauce, apricots, cherries, fruit cocktail, grapefruit, grapes, mandarin oranges, melons, peaches, pears, pineapple, and plums. Fresh fruits without skin, such as apricots, avocados, bananas, melons, pineapple, nectarines, and peaches. Keep servings limited to ½ cup or 1 piece. °Meat and Other Protein Sources °Ground or well-cooked tender beef, ham, veal, lamb, pork, or poultry. Eggs, plain cheese. Fish, oysters, shrimp, lobster, and other seafood. Liver, organ meats. Smooth nut butters. °Dairy °All milk products and alternative dairy substitutes, such as soy, rice, almond, and coconut, not containing added whole nuts, seeds, or added fruit. °Beverages °Decaf coffee, fruit, and vegetable juices or smoothies (small amounts, with no pulp or skins, and with fruits from allowed list), sports drinks, herbal tea. °Condiments °Ketchup, mustard, vinegar, cream sauce, cheese sauce, cocoa powder. Spices in moderation, such as allspice, basil, bay leaves, celery powder or leaves, cinnamon, cumin powder, curry powder, ginger, mace, marjoram, onion or garlic powder, oregano, paprika, parsley flakes, ground pepper, rosemary, sage, savory, tarragon, thyme, and turmeric. °Sweets and Desserts °Plain cakes and cookies, pie made with allowed fruit, pudding, custard, cream pie. Gelatin, fruit, ice, sherbet, frozen ice pops. Ice cream, ice milk without nuts. Plain hard candy, honey, jelly, molasses, syrup, sugar, chocolate syrup, gumdrops, marshmallows. Limit overall sugar intake. °Fats and Oil °Margarine, butter, cream, mayonnaise, salad oils, plain salad dressings made from allowed foods. Choose healthy fats such as olive oil, canola oil, and omega-3 fatty acids (such as found in salmon or tuna) when possible. °Other °Bouillon, broth, or cream soups made from allowed foods. Any    strained soup. Casseroles or mixed dishes made with allowed foods. °The items listed above may not be a complete list of recommended foods or beverages. Contact your dietitian for more options. °What foods are not recommended? °Grains °All whole wheat and whole grain breads and crackers. Multigrains, rye, bran seeds, nuts, or coconut. Cereals containing whole grains, multigrains, bran, coconut, nuts, raisins. Cooked or dry oatmeal, steel-cut oats. Coarse wheat cereals, granola. Cereals advertised as high fiber. Potato skins. Whole grain pasta, wild or brown rice. Popcorn. Coconut flour. Bran, buckwheat, corn bread, multigrains, rye, wheat germ. °Vegetables °Fresh, cooked or canned vegetables, such as artichokes, asparagus, beet greens, broccoli, Brussels sprouts, cabbage, celery, cauliflower, corn, eggplant, kale, legumes or beans, okra, peas, and tomatoes. Avoid large servings of any vegetables, especially raw vegetables. °Fruits °Fresh fruits, such as apples with or without skin, berries, cherries, figs, grapes, grapefruit, guavas, kiwis, mangoes, oranges, papayas, pears, persimmons, pineapple, and pomegranate. Prune juice and juices with pulp, stewed or dried prunes. Dried fruits, dates, raisins. Fruit seeds or skins. Avoid large servings of all fresh fruits. °Meats and Other Protein Sources °Tough, fibrous meats with gristle. Chunky nut butter. Cheese made with seeds, nuts, or other foods not recommended. Nuts, seeds, legumes (beans, including baked beans), dried peas, beans, lentils. °Dairy °Yogurt or cheese that contains nuts, seeds, or added fruit. °Beverages °Fruit juices with high pulp, prune juice. Caffeinated coffee and teas. °Condiments °Coconut, maple syrup, pickles, olives. °Sweets and Desserts °Desserts, cookies, or candies that contain nuts or coconut, chunky peanut butter, dried fruits. Jams, preserves with seeds, marmalade. Large amounts of sugar and sweets. Any other dessert made with fruits from  the not recommended list. °Other °Soups made from vegetables that are not recommended or that contain other foods not recommended. °The items listed above may not be a complete list of foods and beverages to avoid. Contact your dietitian for more information. °This information is not intended to replace advice given to you by your health care provider. Make sure you discuss any questions you have with your health care provider. °Document Released: 05/25/2002 Document Revised: 05/10/2016 Document Reviewed: 10/26/2013 °Elsevier Interactive Patient Education © 2017 [employer] ° °

## 2016-12-27 NOTE — Progress Notes (Signed)
  Subjective: He really wants to go home today. No complaints of pain. Tolerating clears well and would like a real diet.  Objective: Vital signs in last 24 hours: Temp:  [97.7 F (36.5 C)-98.2 F (36.8 C)] 97.7 F (36.5 C) (01/11 0535) Pulse Rate:  [59-64] 59 (01/11 0535) Resp:  [16-18] 16 (01/11 0535) BP: (117-140)/(73-84) 132/81 (01/11 0535) SpO2:  [97 %-99 %] 99 % (01/11 0535) Last BM Date: 12/23/16 120 PO recorded 3000 IV 3147 urine  Afebrile, VSS BMP OK, CBC pending No xrays Intake/Output from previous day: 01/10 0701 - 01/11 0700 In: 3147.1 [P.O.:120; I.V.:2927.1; IV Piggyback:100] Out: -  Intake/Output this shift: No intake/output data recorded.  General appearance: alert, cooperative and no distress Resp: clear to auscultation bilaterally GI: soft, non-tender; bowel sounds normal; no masses,  no organomegaly  Lab Results:   Recent Labs  12/25/16 0758 12/26/16 0334  WBC 9.0 7.8  HGB 14.2 13.4  HCT 42.1 40.1  PLT 206 203    BMET  Recent Labs  12/26/16 0334 12/27/16 0502  NA 137 142  K 4.5 4.1  CL 103 109  CO2 25 26  GLUCOSE 83 107*  BUN 8 6  CREATININE 1.23 1.08  CALCIUM 8.5* 8.4*   PT/INR No results for input(s): LABPROT, INR in the last 72 hours.   Recent Labs Lab 12/24/16 1535  AST 22  ALT 26  ALKPHOS 56  BILITOT 0.9  PROT 8.3*  ALBUMIN 3.7     Lipase     Component Value Date/Time   LIPASE 14 12/24/2016 1535     Studies/Results: No results found.  Medications: . enoxaparin (LOVENOX) injection  40 mg Subcutaneous Q24H  . insulin aspart  0-15 Units Subcutaneous Q4H  . pantoprazole (PROTONIX) IV  40 mg Intravenous QHS  . piperacillin-tazobactam (ZOSYN)  IV  3.375 g Intravenous Q8H  . timolol  1 drop Both Eyes BID  . Travoprost (BAK Free)  1 drop Both Eyes QHS    Assessment/Plan Sigmoid diverticulitis with microperforation Hypertension Diabetes Type II FEN: IV fluids/clears ID: Zosyn 12/24/16 =>>day 4 DVT:  Lovenox    Plan: Advance diet switch him over to Augmentin. Add probiotic await CBC results. Possible discharge later today.   LOS: 2 days    Karyl Sharrar 12/27/2016 [phone]

## 2016-12-27 NOTE — Progress Notes (Signed)
Romualdo BolkAugustine Macon to be D/C'd  per MD order. Discussed with the patient and all questions fully answered.  VSS, Skin clean, dry and intact without evidence of skin break down, no evidence of skin tears noted.  IV catheter discontinued intact. Site without signs and symptoms of complications. Dressing and pressure applied.  An After Visit Summary was printed and given to the patient. Patient received prescription.  D/c education completed with patient/family including follow up instructions, medication list, d/c activities limitations if indicated, with other d/c instructions as indicated by MD - patient able to verbalize understanding, all questions fully answered.   Patient instructed to return to ED, call 911, or call MD for any changes in condition.   Patient declined WC transport, and D/C home via private auto.

## 2016-12-27 NOTE — Discharge Summary (Signed)
[facility] Discharge Summary   Patient ID: Andre Wilcox Spake MRN: [id number] DOB/AGE: 62/12/1955 61 y.o.  [place] date: 12/24/2016 Discharge date: 12/27/2016  Admitting Diagnosis: Acute diverticulitis  Discharge Diagnosis Patient Active Problem List   Diagnosis Date Noted  . Diverticulitis large intestine 12/25/2016  . Diabetes mellitus, type II (HCC) 09/02/2013  . Hypertension 09/02/2013  . Glaucoma 09/02/2013  . Chronic back pain 09/02/2013  . Chest pain 09/02/2013    Consultants None  Imaging: CT abdomen pelvis w contrast 12/24/16: 1. Findings consistent with acute sigmoid colon diverticulitis. Small extraluminal gas collections adjacent to the sigmoid colon are suspicious for contained perforation. There is no evidence for abscess. There is no free air identified. 2. Subcentimeter hypodense lesions in the liver and right kidney, too small to further characterize.  Procedures None  Hospital Course:  Andre Wilcox Nyce is a 61yo male PMH HTN and DM,  [facility] with 1 week of LLQ abdominal pain.  Workup showed sigmoid diverticulitis with micro-perforation and leukocytosis.  Patient was admitted for IVF, IV antibiotics, and bowel rest.  Mr. Schreck's pain slowly improved therefore his diet was slowly advanced. By 12/27/16 his WBC had returned to WNL and pain had nearly resolved. He was tolerating a low residue diet and was felt stable for discharge home.  Patient will remain on a low residue diet and he will continue augmentin for an additional week.  Physical Exam: General appearance: alert, cooperative and no distress Resp: clear to auscultation bilaterally GI: soft, non-tender; bowel sounds normal; no masses,  no organomegaly  Allergies as of 12/27/2016   No Known Allergies     Medication List    TAKE these medications   amoxicillin-clavulanate 875-125 MG tablet Commonly known as:  AUGMENTIN Take 1 tablet by mouth 2 (two) times daily.   aspirin  EC 81 MG tablet Take 81 mg by mouth daily.   diphenhydrAMINE 25 MG tablet Commonly known as:  BENADRYL Take 1 tablet (25 mg total) by mouth every 6 (six) hours.   famotidine 20 MG tablet Commonly known as:  PEPCID Take 1 tablet (20 mg total) by mouth 2 (two) times daily.   metFORMIN 1000 MG tablet Commonly known as:  GLUCOPHAGE Take 1 tablet (1,000 mg total) by mouth 2 (two) times daily with a meal.   predniSONE 20 MG tablet Commonly known as:  DELTASONE Take 3 tablets (60 mg total) by mouth daily.   saccharomyces boulardii 250 MG capsule Commonly known as:  FLORASTOR Take 1 capsule (250 mg total) by mouth 2 (two) times daily.   simvastatin 40 MG tablet Commonly known as:  ZOCOR Take 1 tablet (40 mg total) by mouth daily.   sitaGLIPtin 100 MG tablet Commonly known as:  JANUVIA Take 100 mg by mouth daily.   timolol 0.5 % ophthalmic solution Commonly known as:  TIMOPTIC Place 1 drop into both eyes daily. What changed:  when to take this   travoprost (benzalkonium) 0.004 % ophthalmic solution Commonly known as:  TRAVATAN Place 1 drop into both eyes at bedtime.          Signed: Edson SnowballBROOKE A MILLER, [facility] 12/27/2016, 2:10 PM Pager: [phone] Consults: [phone] Mon-Fri 7:00 am-4:30 pm Sat-Sun 7:00 am-11:30 am

## 2017-09-29 IMAGING — CT CT ABD-PELV W/ CM
2 of 5 series · 10 of 46 positions shown, 11 images · IV contrast (iopamidol)
Comparison: None.

CLINICAL DATA: Suprapubic pain for 1 week

EXAM:
CT ABDOMEN AND PELVIS WITH CONTRAST
TECHNIQUE: Multidetector CT imaging of the abdomen and pelvis was performed
using the standard protocol following bolus administration of
intravenous contrast.
CONTRAST:  100mL B19ZPF-JBB IOPAMIDOL (B19ZPF-JBB) INJECTION 61%

[Series 203: coronals, idose (2) · coronal · 0.45mm/px · 3 of 137 slices shown]
[im 46/137  soft-tissue]
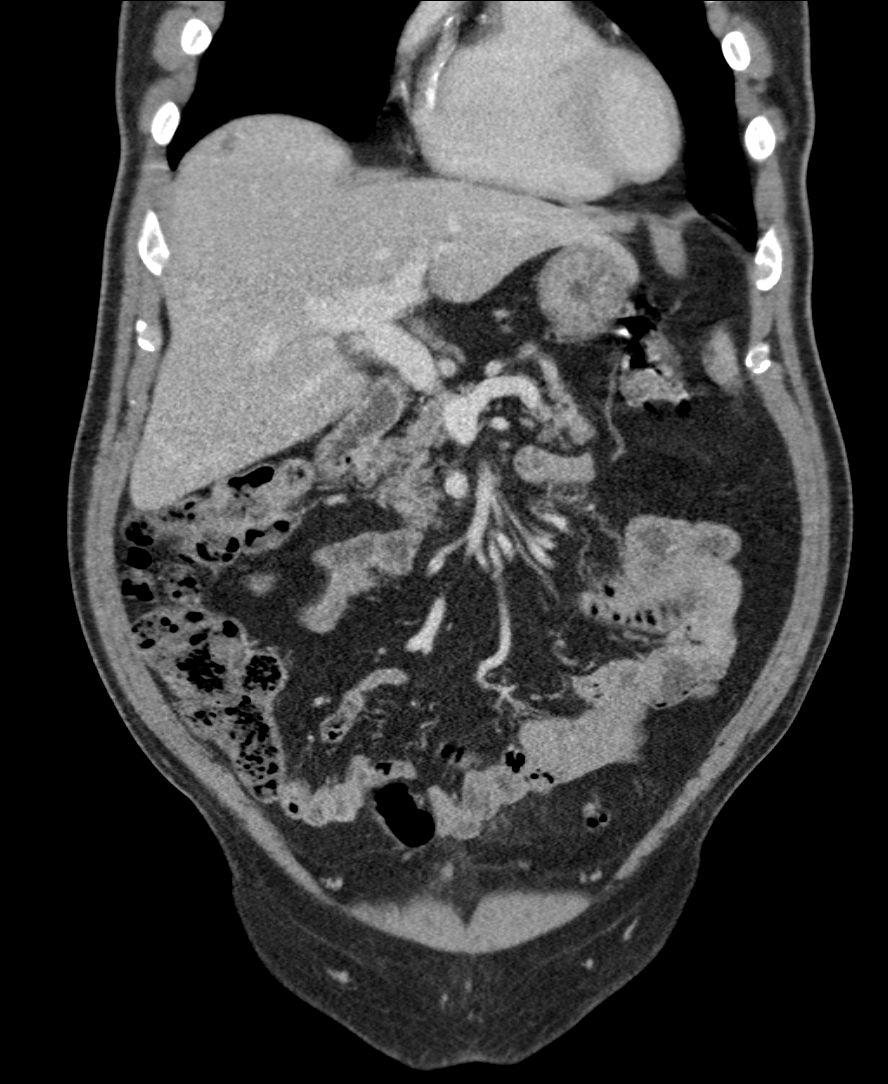
[im 61/137  soft-tissue]
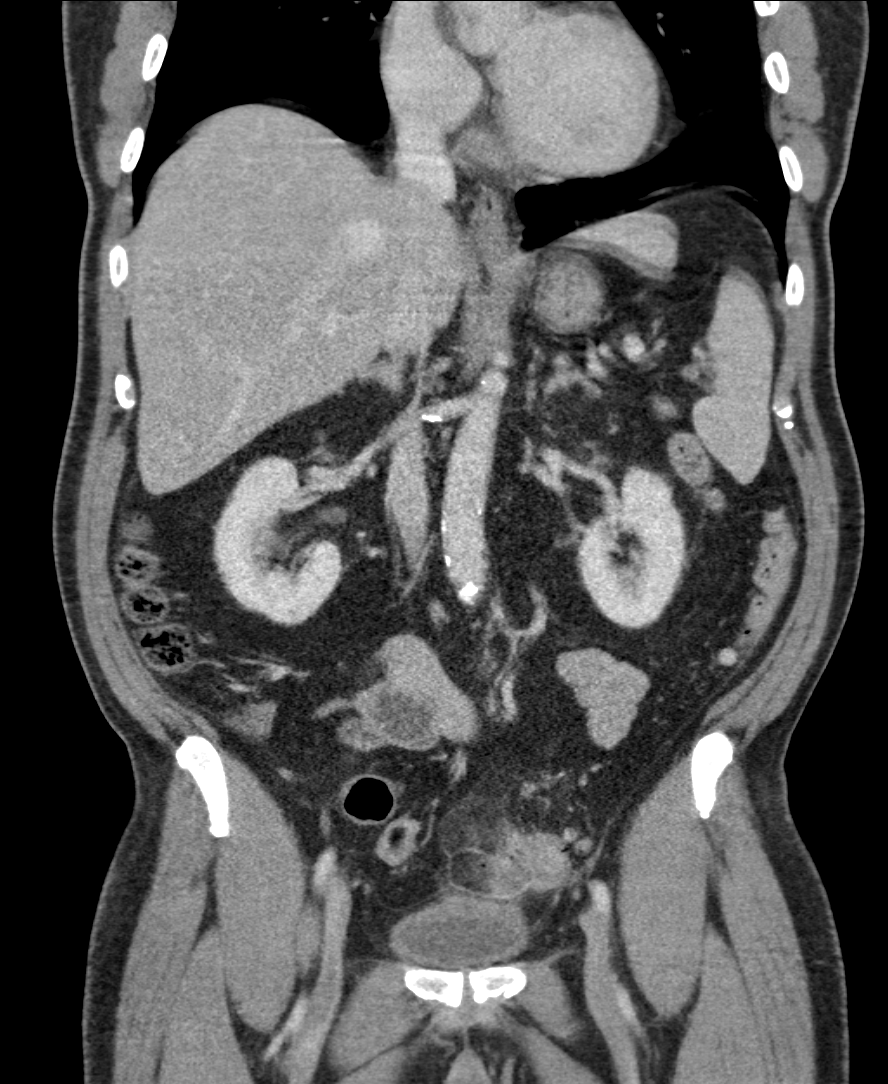
[im 76/137  soft-tissue]
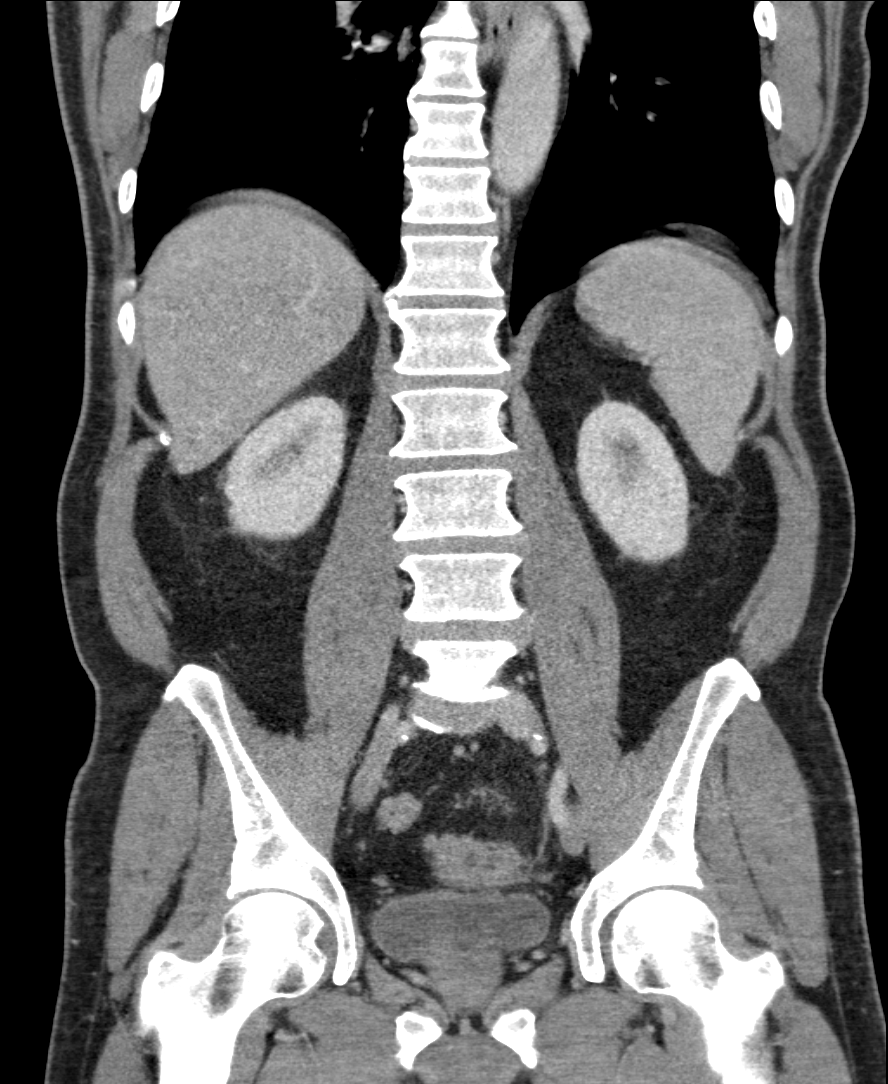

[Series 301: routine, idose (2) · axial · 0.78mm/px · z∈[-508,-108]mm · 7 of 102 slices shown, 8 images]
[im 11/102  soft-tissue]
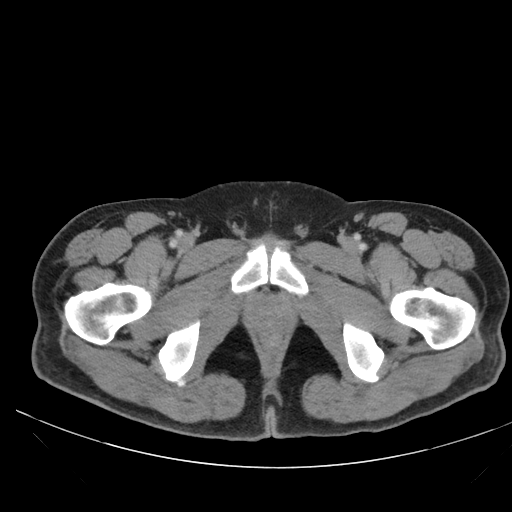
[im 11/102  bone]
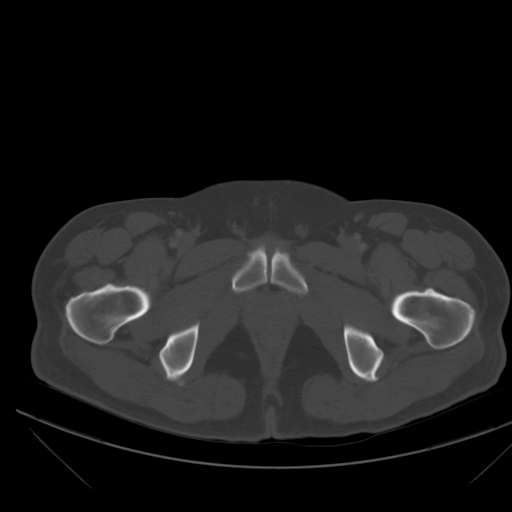
[im 22/102  soft-tissue]
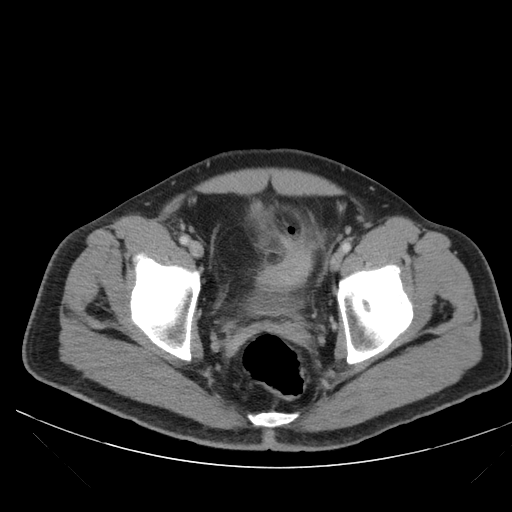
[im 38/102  soft-tissue]
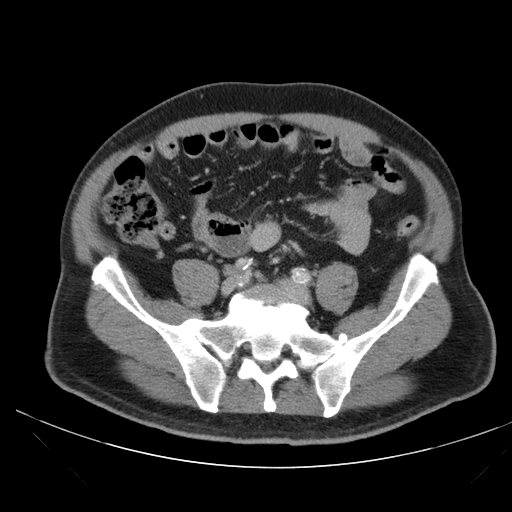
[im 54/102  soft-tissue]
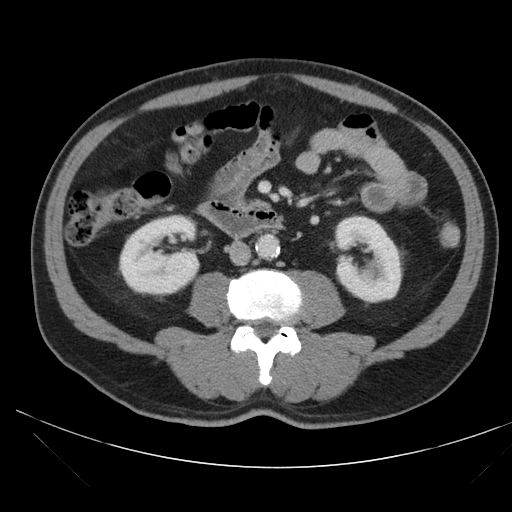
[im 64/102  soft-tissue]
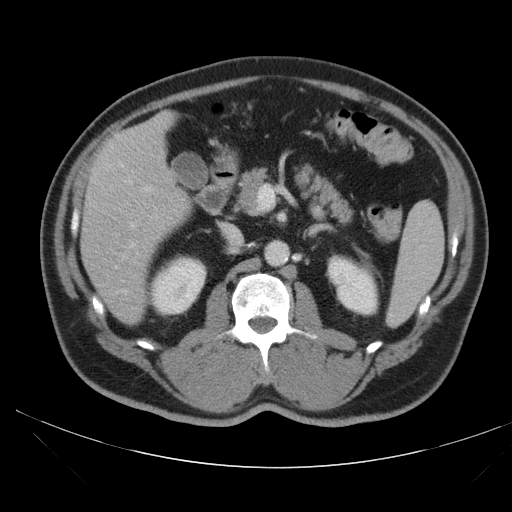
[im 80/102  soft-tissue]
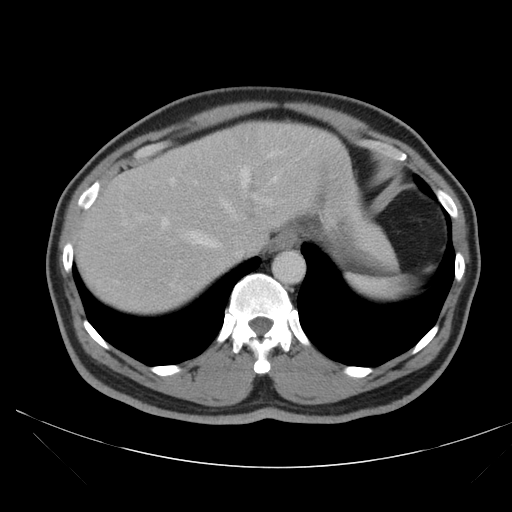
[im 91/102  soft-tissue]
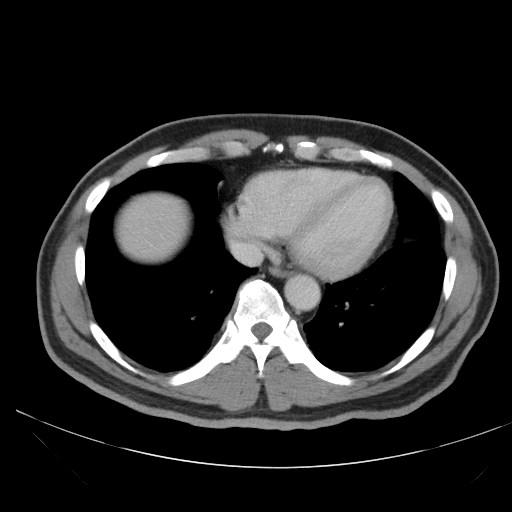

[10 of 46 positions shown; findings below may reference images not displayed]

FINDINGS: Lower chest: Lung bases demonstrate no acute consolidation or
pleural effusion. Heart size within normal limits. Coronary artery
calcifications are present.

Hepatobiliary: Subcentimeter hypodense lesion at the dome of the
liver, too small to further characterize. No biliary dilatation. No
calcified gallstones

Pancreas: Unremarkable. No pancreatic ductal dilatation or
surrounding inflammatory changes.

Spleen: Normal in size without focal abnormality.

Adrenals/Urinary Tract: Adrenal glands within normal limits.
Subcentimeter hypodense lesions in the right kidney, too small to
further characterize. Mild thickening of the anterior wall of the
bladder.

Stomach/Bowel: Stomach is nonenlarged. No evidence for small bowel
obstruction. Appendix normal.

Multiple sigmoid colon diverticula. Focal wall thickening of the
sigmoid colon with adjacent inflammation in the fat consistent with
acute diverticulitis. Small extraluminal gas collections, series
301, image number 80 and series 301, image number 79 are suspect for
contained perforation. No abscess. No free air.

Vascular/Lymphatic: Aortic atherosclerosis. No enlarged abdominal or
pelvic lymph nodes.

Reproductive: Slightly enlarged prostate.

Other: No free fluid.  No free air.

Musculoskeletal: Degenerative changes at L5-S1. No acute or
suspicious bone lesions.
IMPRESSION: 1. Findings consistent with acute sigmoid colon diverticulitis.
Small extraluminal gas collections adjacent to the sigmoid colon are
suspicious for contained perforation. There is no evidence for
abscess. There is no free air identified.
2. Subcentimeter hypodense lesions in the liver and right kidney,
too small to further characterize.

## 2019-10-06 ENCOUNTER — Other Ambulatory Visit: Payer: Self-pay

## 2019-10-06 ENCOUNTER — Encounter (HOSPITAL_COMMUNITY): Payer: Self-pay | Admitting: *Deleted

## 2019-10-06 ENCOUNTER — Emergency Department (HOSPITAL_COMMUNITY)
Admission: EM | Admit: 2019-10-06 | Discharge: 2019-10-06 | Disposition: A | Discharge status: Home / Self Care | Payer: Medicare Other | Attending: Emergency Medicine | Admitting: Emergency Medicine

## 2019-10-06 DIAGNOSIS — Z87891 Personal history of nicotine dependence: Secondary | ICD-10-CM | POA: Diagnosis not present

## 2019-10-06 DIAGNOSIS — M5441 Lumbago with sciatica, right side: Secondary | ICD-10-CM | POA: Insufficient documentation

## 2019-10-06 DIAGNOSIS — Z7984 Long term (current) use of oral hypoglycemic drugs: Secondary | ICD-10-CM | POA: Insufficient documentation

## 2019-10-06 DIAGNOSIS — E119 Type 2 diabetes mellitus without complications: Secondary | ICD-10-CM | POA: Insufficient documentation

## 2019-10-06 DIAGNOSIS — I10 Essential (primary) hypertension: Secondary | ICD-10-CM | POA: Diagnosis not present

## 2019-10-06 DIAGNOSIS — M79604 Pain in right leg: Secondary | ICD-10-CM | POA: Diagnosis present

## 2019-10-06 DIAGNOSIS — Z7982 Long term (current) use of aspirin: Secondary | ICD-10-CM | POA: Diagnosis not present

## 2019-10-06 DIAGNOSIS — M5431 Sciatica, right side: Secondary | ICD-10-CM

## 2019-10-06 DIAGNOSIS — G8929 Other chronic pain: Secondary | ICD-10-CM

## 2019-10-06 DIAGNOSIS — Z79899 Other long term (current) drug therapy: Secondary | ICD-10-CM | POA: Diagnosis not present

## 2019-10-06 LAB — CBG MONITORING, ED: Glucose-Capillary: 129 mg/dL — ABNORMAL HIGH (ref 70–99)

## 2019-10-06 MED ORDER — KETOROLAC TROMETHAMINE 30 MG/ML IJ SOLN
15.0000 mg | Freq: Once | INTRAMUSCULAR | Status: AC
  Administered 2019-10-06: 17:00:00 15 mg via INTRAMUSCULAR
  Filled 2019-10-06: qty 1

## 2019-10-06 MED ORDER — LIDOCAINE 5 % EX PTCH
1.0000 | MEDICATED_PATCH | CUTANEOUS | Status: DC
  Administered 2019-10-06: 1 via TRANSDERMAL
  Filled 2019-10-06: qty 1

## 2019-10-06 MED ORDER — METHOCARBAMOL 500 MG PO TABS: 500.0000 mg | ORAL_TABLET | Freq: Two times a day (BID) | ORAL | 0 refills | Status: AC | PRN

## 2019-10-06 NOTE — Discharge Instructions (Signed)
Continue taking your home medications, including the naproxen.  Use the muscle relaxer as needed for pain control. Have caution, this may make you tired and groggy. Do not drive or operate heavy machinery while taking this medication.  Follow up closely with your primary care doctor for reevaluation of your symptoms.  Return to the ER if you develop fevers, loss of bowel or bladder control, or persistent numbness. Return with any new, worsening, or concerning symptoms.

## 2019-10-06 NOTE — ED Provider Notes (Signed)
[facility] EMERGENCY DEPARTMENT Provider Note   CSN: [id number] Arrival date & time: 10/06/19  1558     History   Chief Complaint Chief Complaint  Patient presents with   Leg Pain    right    HPI Andre Wilcox is a 63 y.o. male presented for evaluation of right leg pain.  Patient states of the past 3 days, he has been having worsening persistent right-sided leg pain.  He states it feels consistent with his normal sciatica.  Patient has chronic low back pain, no change in his pain recently.  Patient in his right leg begins in his hip and radiates down to the bottom of his foot.  It is worse with ambulation.  Nothing has made it better other than rest.  She denies fall, trauma, or injury.  He denies numbness or tingling.  Denies fevers, chills, loss of bowel bladder control, history of cancer, recent history of IVDU.  Patient reports a history of diabetes, does not check his blood sugar but does take his medicine as prescribed.  He is on chronic pain medication, does not know the name.  This is prescribed by his PCP.  He has been using naproxen without improvement of symptoms.  He has not tried anything else.     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 Authorizing Provider  aspirin EC 81 MG tablet Take 81 mg by mouth daily.    [provider]  diphenhydrAMINE (BENADRYL) 25 MG tablet Take 1  tablet (25 mg total) by mouth every 6 (six) hours. Patient not taking: Reported on 12/24/2016 06/22/15   Linton Flemings, MD  famotidine (PEPCID) 20 MG tablet Take 1 tablet (20 mg total) by mouth 2 (two) times daily. Patient not taking: Reported on 12/24/2016 06/22/15   Linton Flemings, MD  metFORMIN (GLUCOPHAGE) 1000 MG tablet Take 1 tablet (1,000 mg total) by mouth 2 (two) times daily with a meal. 02/12/14   Gregor Hams, MD  methocarbamol (ROBAXIN) 500 MG tablet Take 1 tablet (500 mg total) by mouth 2 (two) times daily as needed for muscle spasms. 10/06/19   Romy Mcgue, PA-C  predniSONE (DELTASONE) 20 MG tablet Take 3 tablets (60 mg total) by mouth daily. Patient not taking: Reported on 12/24/2016 06/22/15   Linton Flemings, MD  saccharomyces boulardii (FLORASTOR) 250 MG capsule Take 1 capsule (250 mg total) by mouth 2 (two) times daily. 12/27/16   Meuth, Brooke A, PA-C  simvastatin (ZOCOR) 40 MG tablet Take 1 tablet (40 mg total) by mouth daily. 02/12/14   Gregor Hams, MD  sitaGLIPtin (JANUVIA) 100 MG tablet Take 100 mg by mouth daily.    [provider]  timolol (TIMOPTIC) 0.5 % ophthalmic solution Place 1 drop into both eyes daily. Patient taking differently: Place 1 drop into both eyes 2 (two) times daily.  02/12/14   Rodolph Bong, MD  travoprost, benzalkonium, (TRAVATAN) 0.004 % ophthalmic solution Place 1 drop into both eyes at bedtime. 02/12/14   Rodolph Bong, MD    Family History History reviewed. No pertinent family history.  Social History Social History   Tobacco Use   Smoking status: Former Smoker    Quit date: 09/02/2001    Years since quitting: 18.1   Smokeless tobacco: Never Used  Substance Use Topics   Alcohol use: Yes   Drug use: No     Allergies   Patient has no known allergies.   Review of Systems Review of Systems  Musculoskeletal: Positive for back pain (chronic, no worsening) and myalgias (R leg pain).  All other systems reviewed and are  negative.    Physical Exam Updated Vital Signs BP 113/80 (BP Location: Right Arm)    Pulse 89    Temp 99 F (37.2 C) (Oral)    Resp 16    Ht 6' (1.829 m)    Wt 72.6 kg    SpO2 98%    BMI 21.70 kg/m   Physical Exam Vitals signs and nursing note reviewed.  Constitutional:      General: He is not in acute distress.    Appearance: He is well-developed.     Comments: Sitting comfortably in the bed in NAD  HENT:     Head: Normocephalic and atraumatic.  Eyes:     Conjunctiva/sclera: Conjunctivae normal.     Pupils: Pupils are equal, round, and reactive to light.  Neck:     Musculoskeletal: Normal range of motion and neck supple.  Cardiovascular:     Rate and Rhythm: Normal rate and regular rhythm.     Pulses: Normal pulses.  Pulmonary:     Effort: Pulmonary effort is normal. No respiratory distress.     Breath sounds: Normal breath sounds. No wheezing.  Abdominal:     General: There is no distension.     Palpations: Abdomen is soft. There is no mass.     Tenderness: There is no abdominal tenderness. There is no guarding or rebound.  Musculoskeletal:        General: Tenderness present.     Comments: Tenderness to palpation of right lower back musculature.  No increased pain over midline spine.  No step-offs or deformities.  Increased pain with straight leg raise.  No saddle paresthesias.  Patellar reflexes intact.  Good pedal pulses.  Good distal sensation.  Decreased movement/strength of the right foot due to pain, however patient is able to ambulate without signs of foot drop.   Skin:    General: Skin is warm and dry.     Capillary Refill: Capillary refill takes less than 2 seconds.  Neurological:     Mental Status: He is alert and oriented to person, place, and time.      ED Treatments / Results  Labs (all labs ordered are listed, but only abnormal results are displayed) Labs Reviewed  CBG MONITORING, ED - Abnormal; Notable for the following components:      Result Value    Glucose-Capillary 129 (*)    All other components within normal limits    EKG None  Radiology No results found.  Procedures Procedures (including critical care time)  Medications Ordered in ED Medications  ketorolac (TORADOL) 30 MG/ML injection 15 mg (15 mg Intramuscular Given 10/06/19 1715)     Initial Impression / Assessment and Plan / ED Course  I have reviewed the triage  vital signs and the nursing notes.  Pertinent labs & imaging results that were available during my care of the patient were reviewed by me and considered in my medical decision making (see chart for details).        Patient presenting for evaluation of R leg pain.  Physical exam reassuring, neurovascularly intact.  No red flags for back pain.  Increased pain with straight leg raise.  Reproducible to the right low back musculature, chronic.  Doubt fracture, I do not believe x-rays will be beneficial.  Doubt vertebral injury, infection, spinal cord compression, myelopathy, or cauda equina syndrome.  Likely sciatica, considering patient's history of the same and physical exam.  He has some weakness/decreased range of motion of the right foot, is due to pain.  He is ambulatory without foot drop or signs of weakness.  He is already taking pain medication and NSAIDs.  Will hold on steroids as he is diabetic.  As such, will treat with muscle relaxers and close follow-up with primary care. At this time, patient appears safe for discharge.  Return precautions given.  Patient states he understands and agrees to plan.   Final Clinical Impressions(s) / ED Diagnoses   Final diagnoses:  Chronic right-sided low back pain with right-sided sciatica  Sciatica of right side    ED Discharge Orders         Ordered    methocarbamol (ROBAXIN) 500 MG tablet  2 times daily PRN     10/06/19 1720           Alveria ApleyCaccavale, Nahdia Doucet, PA-C 10/06/19 2136    Derwood KaplanNanavati, Ankit, MD 10/08/19 1821

## 2019-10-06 NOTE — ED Triage Notes (Signed)
Patient presents the to ED with constant right leg pain that begins in the right hip to the right foot for 3 days.

## 2024-10-22 NOTE — Progress Notes (Signed)
  Cardiology Office Note:   Date:  10/22/2024  ID:  Andre Wilcox, DOB [birth year], MRN [id number] PCP: Shelda Atlas, MD  [facility] Providers Cardiologist:  None { Chief Complaint: No chief complaint on file.     History of Present Illness:   Andre Wilcox is a 68 y.o. male with a PMH of CAD s/p CABG (2022), HTN, HLD, DM2 and diverticulitis who presents for follow up ***.   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  {Refresh Note OR Click here to enter BP  :1}***        Physical Exam:     VS:  There were no vitals taken for this visit. ***    Wt Readings from Last 3 Encounters:  10/06/19 160 lb (72.6 kg)  12/25/16 188 lb 14.4 oz (85.7 kg)  06/21/15 204 lb (92.5 kg)     GEN: Well nourished, well developed, in no acute distress NECK: No JVD; No carotid bruits CARDIAC: ***RRR, no murmurs, rubs, gallops RESPIRATORY:  Clear to auscultation without rales, wheezing or rhonchi  ABDOMEN: Soft, non-tender, non-distended, normal bowel sounds EXTREMITIES:  Warm and well perfused, no edema; No deformity, 2+ radial pulses PSYCH: Normal mood and affect   Assessment & Plan       {Are you ordering a CV Procedure (e.g. stress test, cath, DCCV, TEE, etc)?   Press F2        :[id number]}   This note was written with the assistance of a dictation microphone or AI dictation software. Please excuse any typos or grammatical errors.   Signed, Georganna Archer,  MD 10/22/2024 8:47 PM    [facility]

## 2024-10-23 ENCOUNTER — Encounter: Payer: Self-pay | Admitting: Student in an Organized Health Care Education/Training Program

## 2024-10-23 ENCOUNTER — Ambulatory Visit
Attending: Student in an Organized Health Care Education/Training Program | Admitting: Student in an Organized Health Care Education/Training Program

## 2024-10-23 ENCOUNTER — Other Ambulatory Visit (HOSPITAL_COMMUNITY): Payer: Self-pay

## 2024-10-23 VITALS — BP 100/66 | HR 75 | Ht 72.0 in | Wt 171.9 lb

## 2024-10-23 DIAGNOSIS — I10 Essential (primary) hypertension: Secondary | ICD-10-CM

## 2024-10-23 DIAGNOSIS — E782 Mixed hyperlipidemia: Secondary | ICD-10-CM | POA: Insufficient documentation

## 2024-10-23 DIAGNOSIS — R0609 Other forms of dyspnea: Secondary | ICD-10-CM | POA: Diagnosis not present

## 2024-10-23 DIAGNOSIS — I25810 Atherosclerosis of coronary artery bypass graft(s) without angina pectoris: Secondary | ICD-10-CM | POA: Diagnosis not present

## 2024-10-23 DIAGNOSIS — E1140 Type 2 diabetes mellitus with diabetic neuropathy, unspecified: Secondary | ICD-10-CM

## 2024-10-23 MED ORDER — ROSUVASTATIN CALCIUM 40 MG PO TABS
40.0000 mg | ORAL_TABLET | Freq: Every day | ORAL | 3 refills | Status: AC
  Filled 2024-10-23: qty 90, 90d supply, fill #0

## 2024-10-23 MED ORDER — ROSUVASTATIN CALCIUM 40 MG PO TABS: 40.0000 mg | ORAL_TABLET | Freq: Every day | ORAL | 3 refills | Status: DC

## 2024-10-23 NOTE — Assessment & Plan Note (Signed)
-   Patient with history of multivessel CAD s/p CABG.  The records of his CABG I do not have available, but he reportedly had it at [facility]. -The patient has only been on a baby aspirin  and no cholesterol-lowering therapy.  He also has ongoing DOE which makes me concerned for either new obstructive CAD or CHF as discussed below. -I expressed to him that it is critically imperative that we pursue aggressive secondary prevention of CAD for his cardiovascular health. - He never had cardiac rehab and is interested in trying.  I want to do additional workup before pursuing rehabilitation. Continue baby aspirin  indefinitely Start Crestor 40 mg daily Check lipid panel Check CMP Consider cardiac rehab once cardiac evaluation is complete Follow-up in 3 months

## 2024-10-23 NOTE — Assessment & Plan Note (Signed)
-   Blood pressure is well controlled today off of any antihypertensive regiment. -No changes

## 2024-10-23 NOTE — Assessment & Plan Note (Signed)
 Check A1c Continue Ozempic Continue Jardiance

## 2024-10-23 NOTE — Patient Instructions (Signed)
 Medication Instructions:  START: Rosuvastatin (Crestor) 40 mg (1 tablet) daily  *If you need a refill on your cardiac medications before your next appointment, please call your pharmacy*  Lab Work: TODAY: CMET, A1c, Lipid If you have labs (blood work) drawn today and your tests are completely normal, you will receive your results only by: MyChart Message (if you have MyChart) OR A paper copy in the mail If you have any lab test that is abnormal or we need to change your treatment, we will call you to review the results.  Testing/Procedures: Your physician has requested that you have an echocardiogram. Echocardiography is a painless test that uses sound waves to create images of your heart. It provides your doctor with information about the size and shape of your heart and how well your heart's chambers and valves are working. This procedure takes approximately one hour. There are no restrictions for this procedure. Please do NOT wear cologne, perfume, aftershave, or lotions (deodorant is allowed). Please arrive 15 minutes prior to your appointment time.  Please note: We ask at that you not bring children with you during ultrasound (echo/ vascular) testing. Due to room size and safety concerns, children are not allowed in the ultrasound rooms during exams. Our front office staff cannot provide observation of children in our lobby area while testing is being conducted. An adult accompanying a patient to their appointment will only be allowed in the ultrasound room at the discretion of the ultrasound technician under special circumstances. We apologize for any inconvenience.   Follow-Up: At [facility], you and your health needs are our priority.  As part of our continuing mission to provide you with exceptional heart care, our providers are all part of one team.  This team includes your primary Cardiologist (physician) and Advanced Practice Providers or APPs (Physician Assistants and  Nurse Practitioners) who all work together to provide you with the care you need, when you need it.  Your next appointment:   3 month(s)  Provider:   Dr. Floretta  We recommend signing up for the patient portal called MyChart.  Sign up information is provided on this After Visit Summary.  MyChart is used to connect with patients for Virtual Visits (Telemedicine).  Patients are able to view lab/test results, encounter notes, upcoming appointments, etc.  Non-urgent messages can be sent to your provider as well.   To learn more about what you can do with MyChart, go to [email or web address].

## 2024-10-23 NOTE — Assessment & Plan Note (Signed)
-   Last LDL was entirely too high. -Will check a lipid panel today and start lipid-lowering therapy. Start Crestor 40 mg daily Lipid panel Recheck lipids in 3 months as well

## 2024-10-26 ENCOUNTER — Ambulatory Visit: Payer: Self-pay | Admitting: Student in an Organized Health Care Education/Training Program

## 2024-10-26 LAB — COMPREHENSIVE METABOLIC PANEL WITH GFR
ALT: 15 IU/L (ref 0–44)
Albumin: 4.3 g/dL (ref 3.9–4.9)
Alkaline Phosphatase: 124 IU/L — ABNORMAL HIGH (ref 47–123)
BUN/Creatinine Ratio: 14 (ref 10–24)
BUN: 21 mg/dL (ref 8–27)
Bilirubin Total: 0.8 mg/dL (ref 0.0–1.2)
CO2: 18 mmol/L — ABNORMAL LOW (ref 20–29)
Calcium: 9.7 mg/dL (ref 8.6–10.2)
Chloride: 93 mmol/L — ABNORMAL LOW (ref 96–106)
Creatinine, Ser: 1.45 mg/dL — ABNORMAL HIGH (ref 0.76–1.27)
Globulin, Total: 4 g/dL (ref 1.5–4.5)
Sodium: 131 mmol/L — ABNORMAL LOW (ref 134–144)
Total Protein: 8.3 g/dL (ref 6.0–8.5)
eGFR: 52 mL/min/1.73 — ABNORMAL LOW (ref >59)

## 2024-10-26 LAB — LIPID PANEL
Chol/HDL Ratio: 4.2 ratio (ref 0.0–5.0)
Cholesterol, Total: 205 mg/dL — ABNORMAL HIGH (ref 100–199)
HDL: 49 mg/dL (ref >39)
LDL Chol Calc (NIH): 128 mg/dL — ABNORMAL HIGH (ref 0–99)
Triglycerides: 155 mg/dL — ABNORMAL HIGH (ref 0–149)
VLDL Cholesterol Cal: 28 mg/dL (ref 5–40)

## 2024-10-26 LAB — HEMOGLOBIN A1C
Est. average glucose Bld gHb Est-mCnc: 315 mg/dL
Hgb A1c MFr Bld: 12.6 % — AB (ref 4.8–5.6)

## 2024-10-28 NOTE — Progress Notes (Signed)
 Attempted to contact patient, unable to leave message. Will try again at later time.

## 2024-11-04 NOTE — Progress Notes (Signed)
 Attempted to contact patient, unable to leave message. Will try again at later time.

## 2024-11-06 NOTE — Progress Notes (Signed)
 Attempted to contact patient, unable to leave message. Will try again at later time.

## 2024-11-10 NOTE — Progress Notes (Signed)
 Attempted to contact patient, unable to leave message. Will mail letter to contact office.

## 2024-11-26 ENCOUNTER — Ambulatory Visit (HOSPITAL_COMMUNITY)
Admission: RE | Admit: 2024-11-26 | Discharge: 2024-11-26 | Attending: Student in an Organized Health Care Education/Training Program

## 2024-11-26 DIAGNOSIS — R0609 Other forms of dyspnea: Secondary | ICD-10-CM | POA: Diagnosis present

## 2024-11-26 DIAGNOSIS — I10 Essential (primary) hypertension: Secondary | ICD-10-CM | POA: Diagnosis present

## 2024-11-26 LAB — ECHOCARDIOGRAM COMPLETE
AR max vel: 2.08 cm2
AV Area VTI: 2.31 cm2
AV Area mean vel: 1.88 cm2
AV Mean grad: 3.1 mmHg
AV Peak grad: 6 mmHg
Ao pk vel: 1.22 m/s
Area-P 1/2: 3.08 cm2
Calc EF: 48 %
S' Lateral: 3.1 cm
Single Plane A2C EF: 37.1 %
Single Plane A4C EF: 57.1 %

## 2024-11-26 NOTE — Progress Notes (Signed)
*  PRELIMINARY RESULTS* Echocardiogram 2D Echocardiogram has been performed.  Andre Wilcox 11/26/2024, 1:01 PM

## 2024-12-14 NOTE — Telephone Encounter (Signed)
 Patient returned RN's call/responding to letter regarding results.

## 2025-01-26 ENCOUNTER — Ambulatory Visit: Admitting: Student in an Organized Health Care Education/Training Program
